# Patient Record
Sex: Female | Born: 1985 | Race: White | Hispanic: Yes | Marital: Married
Health system: Southern US, Community
[De-identification: ages and names within clinical notes are randomized; demographics above are authoritative.]

## PROBLEM LIST (undated history)

## (undated) DIAGNOSIS — K219 Gastro-esophageal reflux disease without esophagitis: Secondary | ICD-10-CM

## (undated) DIAGNOSIS — N200 Calculus of kidney: Secondary | ICD-10-CM

## (undated) DIAGNOSIS — Z9889 Other specified postprocedural states: Secondary | ICD-10-CM

## (undated) DIAGNOSIS — I10 Essential (primary) hypertension: Secondary | ICD-10-CM

## (undated) DIAGNOSIS — N920 Excessive and frequent menstruation with regular cycle: Secondary | ICD-10-CM

## (undated) DIAGNOSIS — Z8679 Personal history of other diseases of the circulatory system: Secondary | ICD-10-CM

## (undated) DIAGNOSIS — R002 Palpitations: Secondary | ICD-10-CM

## (undated) HISTORY — DX: Calculus of kidney: N20.0

## (undated) HISTORY — PX: LITHOTRIPSY: SUR834

---
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
## 2005-04-28 ENCOUNTER — Inpatient Hospital Stay: Payer: Self-pay | Admitting: Obstetrics and Gynecology

## 2007-07-02 ENCOUNTER — Other Ambulatory Visit: Payer: Self-pay

## 2007-07-02 ENCOUNTER — Emergency Department: Payer: Self-pay | Admitting: Emergency Medicine

## 2007-07-03 ENCOUNTER — Other Ambulatory Visit: Payer: Self-pay

## 2009-08-26 ENCOUNTER — Inpatient Hospital Stay: Payer: Self-pay

## 2014-01-25 ENCOUNTER — Observation Stay: Payer: Self-pay | Admitting: Obstetrics and Gynecology

## 2014-01-25 LAB — URINALYSIS, COMPLETE
Bacteria: NEGATIVE
Bilirubin,UR: NEGATIVE
Glucose,UR: NEGATIVE mg/dL (ref 0–75)
Ketone: NEGATIVE
NITRITE: NEGATIVE
Ph: 6 (ref 4.5–8.0)
Protein: 30
SPECIFIC GRAVITY: 1.01 (ref 1.003–1.030)

## 2014-01-28 LAB — CBC WITH DIFFERENTIAL/PLATELET
Basophil #: 0 10*3/uL (ref 0.0–0.1)
Basophil %: 0.1 %
EOS PCT: 0.1 %
Eosinophil #: 0 10*3/uL (ref 0.0–0.7)
HCT: 29.8 % — ABNORMAL LOW (ref 35.0–47.0)
HGB: 9.8 g/dL — ABNORMAL LOW (ref 12.0–16.0)
LYMPHS ABS: 1.1 10*3/uL (ref 1.0–3.6)
Lymphocyte %: 7 %
MCH: 31.2 pg (ref 26.0–34.0)
MCHC: 33 g/dL (ref 32.0–36.0)
MCV: 95 fL (ref 80–100)
Monocyte #: 1 x10 3/mm — ABNORMAL HIGH (ref 0.2–0.9)
Monocyte %: 6.9 %
NEUTROS ABS: 13 10*3/uL — AB (ref 1.4–6.5)
Neutrophil %: 85.9 %
PLATELETS: 177 10*3/uL (ref 150–440)
RBC: 3.16 10*6/uL — ABNORMAL LOW (ref 3.80–5.20)
RDW: 13.6 % (ref 11.5–14.5)
WBC: 15.1 10*3/uL — AB (ref 3.6–11.0)

## 2014-01-28 LAB — COMPREHENSIVE METABOLIC PANEL
ALBUMIN: 2.5 g/dL — AB (ref 3.4–5.0)
ALK PHOS: 179 U/L — AB
Anion Gap: 11 (ref 7–16)
BILIRUBIN TOTAL: 0.3 mg/dL (ref 0.2–1.0)
BUN: 9 mg/dL (ref 7–18)
CALCIUM: 8.6 mg/dL (ref 8.5–10.1)
CHLORIDE: 106 mmol/L (ref 98–107)
CO2: 20 mmol/L — AB (ref 21–32)
Creatinine: 0.9 mg/dL (ref 0.60–1.30)
EGFR (African American): >60
EGFR (Non-African Amer.): >60
GLUCOSE: 87 mg/dL (ref 65–99)
Osmolality: 272 (ref 275–301)
POTASSIUM: 3.7 mmol/L (ref 3.5–5.1)
SGOT(AST): 28 U/L (ref 15–37)
SGPT (ALT): 19 U/L
Sodium: 137 mmol/L (ref 136–145)
Total Protein: 6.7 g/dL (ref 6.4–8.2)

## 2014-01-28 LAB — URINALYSIS, COMPLETE
BACTERIA: NONE SEEN
Bilirubin,UR: NEGATIVE
Blood: NEGATIVE
Glucose,UR: NEGATIVE mg/dL (ref 0–75)
LEUKOCYTE ESTERASE: NEGATIVE
NITRITE: NEGATIVE
PH: 6 (ref 4.5–8.0)
PROTEIN: NEGATIVE
RBC,UR: 1 /HPF (ref 0–5)
Specific Gravity: 1.014 (ref 1.003–1.030)
Squamous Epithelial: 1

## 2014-01-29 ENCOUNTER — Inpatient Hospital Stay: Payer: Self-pay | Admitting: Obstetrics and Gynecology

## 2014-01-29 ENCOUNTER — Ambulatory Visit: Payer: Self-pay | Admitting: Urology

## 2014-01-29 LAB — URINE CULTURE

## 2014-01-30 ENCOUNTER — Ambulatory Visit: Payer: Self-pay | Admitting: Urology

## 2014-01-30 LAB — HEMATOCRIT: HCT: 24.3 % — AB (ref 35.0–47.0)

## 2014-02-01 LAB — BASIC METABOLIC PANEL
Anion Gap: 9 (ref 7–16)
BUN: 12 mg/dL (ref 7–18)
CHLORIDE: 108 mmol/L — AB (ref 98–107)
CREATININE: 0.59 mg/dL — AB (ref 0.60–1.30)
Calcium, Total: 7.5 mg/dL — ABNORMAL LOW (ref 8.5–10.1)
Co2: 25 mmol/L (ref 21–32)
EGFR (Non-African Amer.): >60
Glucose: 61 mg/dL — ABNORMAL LOW (ref 65–99)
Osmolality: 281 (ref 275–301)
Potassium: 3.7 mmol/L (ref 3.5–5.1)
Sodium: 142 mmol/L (ref 136–145)

## 2014-02-04 DIAGNOSIS — N201 Calculus of ureter: Secondary | ICD-10-CM

## 2014-02-04 HISTORY — DX: Calculus of ureter: N20.1

## 2014-03-02 HISTORY — PX: LITHOTRIPSY: SUR834

## 2014-10-28 NOTE — Consult Note (Signed)
Chief Complaint:  Subjective/Chief Complaint LEFT UPJ stone  Subjective: NAD, resting comfortably. AFVSS.   VITAL SIGNS/ANCILLARY NOTES: **Vital Signs.:   28-Jul-15 12:08  Vital Signs Type Routine  Temperature Temperature (F) 98.4  Celsius 36.8  Temperature Source oral  Pulse Pulse 80  Respirations Respirations 18  Systolic BP Systolic BP 413  Diastolic BP (mmHg) Diastolic BP (mmHg) 68  Mean BP 80  BP Source  if not from Vital Sign Device non-invasive  *Intake and Output.:   Daily 28-Jul-15 07:00  Grand Totals Intake:  3004 Output:  1400    Net:  2440 24 Hr.:  1027    08:15  Grand Totals Intake:  240 Output:      Net:  240 24 Hr.:  240    Shift 15:00  Grand Totals Intake:  240 Output:      Net:  240 24 Hr.:  240   Brief Assessment:  GEN well developed, well nourished, no acute distress   Cardiac Regular   Respiratory normal resp effort   Gastrointestinal Normal   Gastrointestinal details normal Soft  Nontender  Nondistended   EXTR negative cyanosis/clubbing   Additional Physical Exam GU: no CVA tenderness Psych: appropriate mood & affect, conversant, answers questions appropriately, pleasant, accompanied by husband Neuro: A&Ox3, NCAT, EOMI   Lab Results: Hepatic:  25-Jul-15 01:39   Bilirubin, Total 0.3  Alkaline Phosphatase  179 (46-116 NOTE: New Reference Range 01/24/14)  SGPT (ALT) 19 (14-63 NOTE: New Reference Range 01/24/14)  SGOT (AST) 28  Total Protein, Serum 6.7  Albumin, Serum  2.5  Routine BB:  25-Jul-15 01:39   ABO Group + Rh Type O Positive  Antibody Screen NEGATIVE (Result(s) reported on 29 Jan 2014 at 02:36AM.)  General Ref:  25-Jul-15 01:39   RPR w/ Reflex to Qt RPR and Conf. TP-PA ========== TEST NAME ==========  ========= RESULTS =========  = REFERENCE RANGE =  RPR W/REFLX QN,CONF TPPA  RPR, Rfx Qn RPR/Confirm TP RPR                             [   Non Reactive         ]      Non Reactive               [facility]             No: [id number]           [address], [place], [place] [address]           Lindon Romp, MD         [phone]   Result(s) reported on 30 Jan 2014 at 08:41AM.  Routine Chem:  25-Jul-15 01:39   Glucose, Serum 87  BUN 9  Creatinine (comp) 0.90  Sodium, Serum 137  Potassium, Serum 3.7  Chloride, Serum 106  CO2, Serum  20  Calcium (Total), Serum 8.6  Osmolality (calc) 272  eGFR (African American) >60  eGFR (Non-African American) >60 (eGFR values <42m/min/1.73 m2 may be an indication of chronic kidney disease (CKD). Calculated eGFR is useful in patients with stable renal function. The eGFR calculation will not be reliable in acutely ill patients when serum creatinine is changing rapidly. It is not useful in  patients on dialysis. The eGFR calculation may not be applicable to patients at the low and high extremes of body sizes, pregnant women, and vegetarians.)  Anion Gap 11  Routine Hem:  25-Jul-15 01:39   Hematocrit (CBC)  29.8  WBC (CBC)  15.1  RBC (CBC)  3.16  Hemoglobin (CBC)  9.8  Platelet Count (CBC) 177  MCV 95  MCH 31.2  MCHC 33.0  RDW 13.6  Neutrophil % 85.9  Lymphocyte % 7.0  Monocyte % 6.9  Eosinophil % 0.1  Basophil % 0.1  Neutrophil #  13.0  Lymphocyte # 1.1  Monocyte #  1.0  Eosinophil # 0.0  Basophil # 0.0 (Result(s) reported on 28 Jan 2014 at 03:06AM.)   Radiology Results: CT:    27-Jul-15 14:03, CT Abdomen Pelvis WO for Stone  CT Abdomen Pelvis WO for Stone   REASON FOR EXAM:    flank pain, recent ultrasound suggests presence of   left UPJ stone  COMMENTS:       PROCEDURE: CT  - CT ABDOMEN /PELVIS WO (STONE)  - Jan 30 2014  2:03PM     CLINICAL DATA:  Left flank pain.    EXAM:  CT ABDOMEN AND PELVIS WITHOUT CONTRAST    TECHNIQUE:  Multidetector CT imaging of the abdomen and pelvis was performed  following the standard protocol without IV contrast.  COMPARISON:  None.    FINDINGS:  Visualized lung bases appear  normal. No significant osseous  abnormalityis noted.    No gallstones are noted. No focal abnormality is noted the liver,  spleen or pancreas. Patient is status post recent Cesarean section.  Enlarged uterus is noted consistent with postpartum status. Soft  tissue gas is noted in the anteriorsubcutaneous tissues consistent  with recent surgery. Adrenal glands appear normal. Right kidney  appears normal. Mild left hydronephrosis is noted secondary to 9 x 6  mm calculus in the proximal left ureter just beyond the  ureteropelvic junction. The appendix appears normal. Urinary bladder  appears normal.   IMPRESSION:  Enlarged uterus and other postoperative findings consistent with  recent cesarean section.    Mild left hydronephrosis is noted secondary to 9 x 6 mm calculus in  the proximalleft ureter, just beyond the ureteropelvic junction.      Electronically Signed    By: Sabino Dick M.D.    On: 01/30/2014 14:42         Verified By: Marveen Reeks, M.D.,   Assessment/Plan:  Assessment/Plan:  Assessment Pleasant 29 YO woman recently postpartum s/p c-section with a 43m LEFT proximal UPJ stone. CT abdomen/pelvis from 7/27 personally reviewed. UCx negative. Labs stable and normal. Symptoms minimal.   Plan Patient should have outpatient treatment of stone. I again reiterated this in our conversation today. Risks of not treating the stone include potential for urosepsis and renal atrophy over time which can both affect her health in significant ways. I advise follow up with urology in 2 weeks.   I spoke to Dr. WYves Dill local urologist, and provided patient information to him for eventual treatment with lithotripsy and USE. Patient may also follow up at nearby academic institutions, both options and information provided.   I again stressed that should Ms. RTerisa Starrexerience nondescript malaise or fevers at home, she should present to the ED for evaluation for consideration of more urgent left  kidney decompression with a stent vs nephrostomy tube depending on her clinical presentation. She and her husband endorse understanding of the entirety of my conversation and will arrange follow up.   Thank for this interesting consult. It was a pleasure to partake in Ms. RGargus care.   Electronic Signatures: KLum Babe(MD)  (Signed  28-Jul-15 13:41)  Authored: Chief Complaint, VITAL SIGNS/ANCILLARY NOTES, Brief Assessment, Lab Results, Radiology Results, Assessment/Plan   Last Updated: 28-Jul-15 13:41 by Lum Babe (MD)

## 2014-10-28 NOTE — Op Note (Signed)
PATIENT NAME:  Mills KollerROJAS, Gurneet MR#:  [id number] DATE OF BIRTH:  March 10, 1986  DATE OF PROCEDURE:  01/29/2014  PREOPERATIVE DIAGNOSIS:  Breech presentation. Term pregnancy.   POSTOPERATIVE DIAGNOSIS:  Breech presentation. Term pregnancy.  PROCEDURE PERFORMED: Low transverse cesarean section. Placement of On-Q pain pump.   SURGEON: Annamarie MajorPaul Christyn Gutkowski, M.D.   ASSISTANT: Midwife Yetta BarreJones.   ANESTHESIA: Spinal.   ESTIMATED BLOOD LOSS: 500 mL.   COMPLICATIONS: None.   FINDINGS: Normal tubes, ovaries, and uterus, breech presentation. Sacrum anterior, no nuchal  cord or other complications.   DISPOSITION: To the recovery room in stable condition.   TECHNIQUE: The patient is prepped and draped in the usual sterile fashion after adequate anesthesia is obtained in the supine position on the operating room table. Scalpel used to make low transverse skin incision down to level of the rectus fascia which is dissected bilaterally using Mayo scissors. Rectus muscles are separated in midline. The peritoneum is penetrated, the bladder is inferiorly dissected and retracted. A scalpel was used to create a low transverse hysterotomy incision then extended by blunt dissection. Amniotomy then reveals clear fluid and the breech is palpated as sacrum anterior. The breech initially is unable to be rotated and due to its being a frank breech presentation the feet are grasped and delivered through the hysterotomy incision without complication or too much strain on the baby. Gentle downward traction is applied until the level of the axilla is reached, and the arms are carefully swept across the chest and delivered and then the head is easily delivered. The oropharynx is suctioned and umbilical cord is clamped and cut and the infant handed to the pediatric team.   Cord blood is obtained. The placenta is manually extracted. The uterus is externalized and cleansed of all membranes and debris using a moist sponge. The hysterotomy incision  is closed with running 1-Vicryl suture in a locking fashion followed by a second layer to imbricate the first layer with excellent hemostasis noted. The uterus is placed back in the intraabdominal cavity and the paracolic gutters are irrigated with warm saline. Re-examination of the incision reveals excellent hemostasis. The peritoneum is closed with a Vicryl suture.   Trocars are placed through the abdomen into the subfascial space and then these trocars were used to place and thread the Silver Soaker catheters associated with the On-Q pain pump. The rectus fascia is then closed with 0-Maxon suture with careful placement not to incorporate these catheters. The catheters are flushed with 5 mL each of bupivacaine and stabilized into place with a dressing.   Subcutaneous tissues are irrigated and hemostasis is assured using electrocautery. Skin is closed with 4-0 Vicryl suture in a subcuticular fashion followed by placement of Dermabond for complete closure and protection of the incision. The patient goes to the recovery room in stable condition. All sponge, instrument, and needle counts are correct.   ____________________________ R. Annamarie MajorPaul Thuy Atilano, MD rph:lt D: 01/29/2014 10:33:34 ET T: 01/29/2014 11:06:54 ET JOB#: [id number]  cc: Dierdre Searles. Paul Armonie Staten, MD, <Dictator> Nadara MustardOBERT P Zakariah Dejarnette MD ELECTRONICALLY SIGNED 01/29/2014 17:27

## 2014-10-28 NOTE — Consult Note (Signed)
Chief Complaint:  Subjective/Chief Complaint LEFT hydronephrosis, possible UPJ stone  Subjective: Pain minimal, possibly due to recent c-section. AFVSS. Appears comfortable. Spending time with newborn daughter and husband at bedside.   VITAL SIGNS/ANCILLARY NOTES: **Vital Signs.:   27-Jul-15 07:35  Vital Signs Type Routine  Temperature Temperature (F) 98.6  Celsius 37  Temperature Source oral  Pulse Pulse 80  Respirations Respirations 20  Systolic BP Systolic BP 498  Diastolic BP (mmHg) Diastolic BP (mmHg) 70  Mean BP 83  BP Source  if not from Vital Sign Device non-invasive  *Intake and Output.:   27-Jul-15 00:00  Grand Totals Intake:   Output:  300    Net:  -300 24 Hr.:  256    06:02  Grand Totals Intake:   Output:  210    Net:  -210 24 Hr.:  46    Shift 07:00  Grand Totals Intake:   Output:  510    Net:  -510 24 Hr.:  46    Daily 07:00  Grand Totals Intake:  1981 Output:  1935    Net:  25 24 Hr.:  46    07:30  Grand Totals Intake:  240 Output:      Net:  240 24 Hr.:  240    10:33  Grand Totals Intake:  1544 Output:      Net:  2641 58 Hr.:  3094    12:21  Grand Totals Intake:   Output:      Net:   24 Hr.:  0768    12:28  Grand Totals Intake:   Output:  300    Net:  -300 24 Hr.:  0881    Shift 15:00  Grand Totals Intake:  1784 Output:  300    Net:  27 24 Hr.:  1031   Brief Assessment:  GEN well developed, well nourished, no acute distress, post-partum   Cardiac Regular   Respiratory normal resp effort   Gastrointestinal Normal   Gastrointestinal details normal Soft  Nontender  Nondistended   EXTR no c/c/e   Additional Physical Exam GU: voiding, no issues. no CVA tenderness.  Psych: conversant, Tanya, appropriate mood & affect Neuro: NCAT, EOMI, A&Ox3   Lab Results:  Hepatic:  25-Jul-15 01:39   Bilirubin, Total 0.3  Alkaline Phosphatase  179 (46-116 NOTE: New Reference Range 01/24/14)  SGPT (ALT) 19 (14-63 NOTE: New  Reference Range 01/24/14)  SGOT (AST) 28  Total Protein, Serum 6.7  Albumin, Serum  2.5  Routine BB:  25-Jul-15 01:39   ABO Group + Rh Type O Positive  Antibody Screen NEGATIVE (Result(s) reported on 29 Jan 2014 at 02:36AM.)  General Ref:  25-Jul-15 01:39   RPR w/ Reflex to Qt RPR and Conf. TP-PA ========== TEST NAME ==========  ========= RESULTS =========  = REFERENCE RANGE =  RPR W/REFLX QN,CONF TPPA  RPR, Rfx Qn RPR/Confirm TP RPR                             [   Non Reactive         ]      Non Reactive               [facility]            No: [id number]           [address], [place], [place] [address]  Lindon Romp, MD         [phone]   Result(s) reported on 30 Jan 2014 at 08:41AM.  Routine Chem:  25-Jul-15 01:39   Glucose, Serum 87  BUN 9  Creatinine (comp) 0.90  Sodium, Serum 137  Potassium, Serum 3.7  Chloride, Serum 106  CO2, Serum  20  Calcium (Total), Serum 8.6  Osmolality (calc) 272  eGFR (African American) >60  eGFR (Non-African American) >60 (eGFR values <58m/min/1.73 m2 may be an indication of chronic kidney disease (CKD). Calculated eGFR is useful in patients with stable renal function. The eGFR calculation will not be reliable in acutely ill patients when serum creatinine is changing rapidly. It is not useful in  patients on dialysis. The eGFR calculation may not be applicable to patients at the low and high extremes of body sizes, pregnant women, and vegetarians.)  Anion Gap 11  Routine Hem:  25-Jul-15 01:39   Hematocrit (CBC)  29.8  WBC (CBC)  15.1  RBC (CBC)  3.16  Hemoglobin (CBC)  9.8  Platelet Count (CBC) 177  MCV 95  MCH 31.2  MCHC 33.0  RDW 13.6  Neutrophil % 85.9  Lymphocyte % 7.0  Monocyte % 6.9  Eosinophil % 0.1  Basophil % 0.1  Neutrophil #  13.0  Lymphocyte # 1.1  Monocyte #  1.0  Eosinophil # 0.0  Basophil # 0.0 (Result(s) reported on 28 Jan 2014 at [facility])   Radiology Results: [place]     25-Jul-15 11:28, [place]  [place]   REASON FOR EXAM:    severe left flank pain  COMMENTS:       PROCEDURE: [place] - [place]  - Jan 28 2014 11:28AM     CLINICAL DATA:  Severe left flank pain. Thirty-eight week pregnant  patient.    EXAM:  RENAL/URINARY TRACT ULTRASOUND COMPLETE    COMPARISON:  No priors.    FINDINGS:  Right Kidney:  Length: 11 cm. Echogenicity within normal limits. No mass or frank  hydronephrosis visualized. Mild fullness of the right renal pelvis.    Left Kidney:    Length: 12.4 cm. Echogenicity within normal limits. Mild left  hydronephrosis which persisted on postvoid images. Either at or  immediately beyond the left ureteral pelvic junction there is a 8 mm  echogenic focus with posterior acoustic shadowing, concerning for a  ureteral stone. No mass visualized.    Bladder:    Poorly distended.  No jets could be visualized.   IMPRESSION:  1. Mild left hydronephrosis which appears to be related to an 8 mm  calculus at or immediately beyond the left ureteropelvic junction.  2. Mild fullness of the right renal pelvis is likely related to  compression of the distal ureter from a gravid uterus. No frank  right hydronephrosis.  These results will be called to the ordering clinician or  representative by the Radiologist Assistant, and communication  documented in the PACS or zVision Dashboard.      Electronically Signed    By: DVinnie LangtonM.D.    On: 01/28/2014 12:18     Verified By: DEtheleen Mayhew M.D.,   Assessment/Plan:  Assessment/Plan:  Assessment Tanya 29YO [place] s/p c-section delivery 01/29/2014. Initially had left flank pain. Renal ultrasound personally reviewd and demonstrates mild-moderate hydronephrosis and possible presence of a left 840mUPJ stone. AFVSS. UCx negative. Currently, symptoms minimal and may be due to recent c-section.   Plan Discussed possibility of the presence of  an obstructing left  ureteral stone and the fact that CT is not an ideal test to assess stone burden. If patient does have an obstructing UPJ stone, would recommend eventual treatment,. Given that she is AFVSS, has a negative UCx, and otherwise appears comfortable, I do not think this needs to be urgently treated. Risks of not treating include renal parenchymal atrophy 2/2 chronic obstruction, and the possibility of developing an infected obstructed stone and urosepsis which can potentially be very serious. I discussed this in detail with patient and her husband. They endorse understanding.   For the time being, I recommend:  1. non-contrast Stone protocol CT of abdomen/pelvis today.  2. If patient has worsening symptoms (flank pain) or high grade fevers (> 101.5), would recommend more urgent intervention with stent vs nephrostomy tube depending on acuity as a temporizing measure to manage symptoms or allow for drainage of infected urine with future stone treatment at a later date. If patient discharged without treatment, I have advised her to return to ED for evaluation if she experiences fevers at home for worry of an obstructing infected stone.  3. Given potential size and location of stone (71m @ the LEFT UPJ), I do not believe this will pass without intervention. Tentative plan for outpatient treatment (ureteroscopic stone extraction with possible laser lithotripsy) if patient remains AFVSS, uninfected, and her pain/symptoms are adequatly managed with conservative therapy.   Thank you for this interesting consult. It was a pleasure to partake in Ms. RVitelli care.   Electronic Signatures: KLum Babe(MD)  (Signed 27-Jul-15 12:57)  Authored: Chief Complaint, VITAL SIGNS/ANCILLARY NOTES, Brief Assessment, Lab Results, Radiology Results, Assessment/Plan   Last Updated: 27-Jul-15 12:57 by KLum Babe(MD)

## 2014-10-28 NOTE — Consult Note (Signed)
Admit Diagnosis:   BACK PAIN CONTRACTIONS: Onset Date: 29-Jan-2014, Status: Active, Description: BACK PAIN CONTRACTIONS      Admit Reason:   Labor and delivery, indication for care (659.90): Onset Date: 28-Jan-2014, Status: Active, Coding System: ICD9, Coded Name: Unspecified indication for care or intervention related to labor and delivery, unspecified as to episode of care, Description: Code 646.21 Renal disease in pregnancy indicating IOL for management    Tuberculosis: Was treated for 9 months in 1993.  Was 29 or 29 years old.   Denies medical history:    benign tumor removed from abd in teens:     Ondansetron injection,  ( Zofran injection )  4 mg, IV push, q4h PRN for nausea, vomiting  Indication: Nausea/ Vomiting, 28-Jan-2014, Discontinued, Standard   Sodium Chloride 0.9% injection, 3 ml, IV push, q6h, Convert IV to Saline Blanco, 28-Jan-2014, Discontinued, Standard   MorphINE  injection, 4 mg, IV push, once  Indication: Pain, [Med Admin Window: 30 mins before or after scheduled dose], 28-Jan-2014, Completed, Standard   Terbutaline Sulfate injection, ( Brethine injection ) Conditional Order  0.25 mg, Subcutaneous, once  Indication: Bronchospasm/ Hyperkalemia, 28-Jan-2014, Discontinued, Standard   Triazolam tablet, ( Halcion)  0.25 mg Oral at bedtime PRN for sleep  - Indication: Insomnia/ Sedative/ Hypnotic  Instructions:  sleep/ insomnia, 28-Jan-2014, Discontinued, Standard   Dinoprostone 37m Vag suppository,  ( Cervidil 10 mg Vag suppository )  10 mg Vaginal once  -Indication:Abortion/ Missed Abortion/ Cervical Ripening, 28-Jan-2014, Completed, Standard   MorphINE  injection, 2 mg, IV push, q1-2h PRN for moderate pain (4-6/10)  Indication: Pain, [Med Admin Window: 30 mins before or after scheduled dose], 28-Jan-2014, Discontinued, Standard   CefOXitin injection, ( Mefoxin injection )  ***Send with Patient to OR  2 gram, IV Piggyback, once, Infuse over 30  minute(s)  Indication: Infection, if patient >80kg (>176 lbs), 29-Jan-2014, Active, Standard  Home Medications: Medication Status  inte Active   Lab Results:  Routine Micro:  25-Jul-15 23:21   Micro Text Report URINE CULTURE   COMMENT                   NO GROWTH IN 18-24 HOURS   ANTIBIOTIC                       Routine Chem:  25-Jul-15 01:39   Glucose, Serum 87  BUN 9  Creatinine (comp) 0.90  Sodium, Serum 137  Potassium, Serum 3.7  Chloride, Serum 106  CO2, Serum  20  Calcium (Total), Serum 8.6  Osmolality (calc) 272  eGFR (African American) >60  eGFR (Non-African American) >60 (eGFR values <68mmin/1.73 m2 may be an indication of chronic kidney disease (CKD). Calculated eGFR is useful in patients with stable renal function. The eGFR calculation will not be reliable in acutely ill patients when serum creatinine is changing rapidly. It is not useful in  patients on dialysis. The eGFR calculation may not be applicable to patients at the low and high extremes of body sizes, pregnant women, and vegetarians.)  Anion Gap 11  Routine UA:  25-Jul-15 23:21   Color (UA) Yellow  Clarity (UA) Clear  Glucose (UA) Negative  Bilirubin (UA) Negative  Ketones (UA) Trace  Specific Gravity (UA) 1.014  Blood (UA) Negative  pH (UA) 6.0  Protein (UA) Negative  Nitrite (UA) Negative  Leukocyte Esterase (UA) Negative (Result(s) reported on 28 Jan 2014 at 01:28AM.)  RBC (UA) 1 /HPF  WBC (UA) 1 /HPF  Bacteria (  UA) NONE SEEN  Epithelial Cells (UA) 1 /HPF (Result(s) reported on 28 Jan 2014 at 01:28AM.)  Routine Hem:  25-Jul-15 01:39   WBC (CBC)  15.1  RBC (CBC)  3.16  Hematocrit (CBC)  29.8  Platelet Count (CBC) 177   Radiology Results:  Radiology Results: [place]:    25-Jul-15 11:28, US Kidney Bilateral  US Kidney Bilateral  REASON FOR EXAM:    severe left flank pain  COMMENTS:       PROCEDURE: [place]  - US KIDNEY  - Jan 28 2014 11:28AM     CLINICAL DATA:  Severe left flank pain.  Thirty-eight week pregnant  patient.    EXAM:  RENAL/URINARY TRACT ULTRASOUND COMPLETE    COMPARISON:  No priors.    FINDINGS:  Right Kidney:  Length: 11 cm. Echogenicity within normal limits. No mass or frank  hydronephrosis visualized. Mild fullness of the right renal pelvis.    Left Kidney:    Length: 12.4 cm. Echogenicity within normal limits. Mild left  hydronephrosis which persisted on postvoid images. Either at or  immediately beyond the left ureteral pelvic junction there is a 8 mm  echogenic focus with posterior acoustic shadowing, concerning for a  ureteral stone. No mass visualized.    Bladder:    Poorly distended.  No jets could be visualized.   IMPRESSION:  1. Mild left hydronephrosis which appears to be related to an 8 mm  calculus at or immediately beyond the left ureteropelvic junction.  2. Mild fullness of the right renal pelvis is likely related to  compression of the distal ureter from a gravid uterus. No frank  right hydronephrosis.  These results will be called to the ordering clinician or  representative by the Radiologist Assistant, and communication  documented in the PACS or zVision Dashboard.      Electronically Signed    By: Vinnie Langton M.D.    On: 01/28/2014 12:18     Verified By: Etheleen Mayhew, M.D.,    No Known Allergies:    General Aspect Admitted to L&D   Present Illness 29 y.o. admitted to L&D at [redacted] weeks gestation due to flank pain.  She had intermittent sever pain for several days on admit.  She noted nausea and difficulty tolerating PO prior to admission.  Pain remained sever on admission.  U/S at that time showed left hydronephrosis and likely 8 mm Left ureteral stone.  Discussion between patient, mid-wife, and myself at that time regarding urgent stent placement versus induction of labor resulted in patient choosing to induce labor rather that undergo risk of anesthesia for stent placement.  Original plan is for  re-evaluation of ureteral colic sx's and stone post-partum.  Since that decision patient was induced and ultimately proceeded to C-section due to breech presentation.  Post- C-section, patient has limited nausea currently and pain is controlled.   Case History and Physical Exam:  Chief Complaint Nausea/Vomiting  flank pain   Past Surgical History abd surgery (childhood)   Family History Non-Contributory   Neck/Nodes Supple  No Adenopathy   Chest/Lungs Clear   Breasts Not examined   Cardiovascular Normal Sinus Rhythm   Abdomen gravid, s/p c-section, dressed   Genitalia Not examined   Rectal Not examined   Musculoskeletal Full range of motion   Neurological Grossly WNL   Skin WNL    Impression 29 y.o. Female post-partum Day 0 (C-section) with U/S concerning for left hydro and 8 mm distal ureteral stone.  Given acute change  in status with delivery and current pain control will elect to observe at this time. Would recommend Stone protocal CT in next day or so if sx's persist after anethesia wears off.  Urgent stent placement vs. elective stone managment as outpatient would depend on her repeat imaging and symptoms at that time.   Plan - cont. pain control - Recommend non-contrast stone CT in next 24-48 if pain persists or clinically worsens - Left ureteral stent placement vs. outpatient stone managment dependent upon her clinical course and subsequent imaging.   Electronic Signatures: Felicity Coyer (MD)  (Signed 26-Jul-15 10:56)  Authored: Health Issues, Significant Events - History, Medications, Home Medications, Labs, Radiology Results, Allergies, General Aspect/Present Illness, History and Physical Exam, Impression/Plan   Last Updated: 26-Jul-15 10:56 by Felicity Coyer (MD)

## 2014-11-14 NOTE — H&P (Signed)
L&D Evaluation:  History:  HPI 29yo [id number] with LMP of 05/02/13 & EDD of  02/15/14 & US confirmed date of 02/06/14 [facility]. Pt has a hx of MVP with no medications required for procedures. Pt presented today to office with lower Lt lumbar pain rating it as a 9 on 1-10 scale. Pain is intermittant and pt states she cannot eat or rest due to the severity of the discomfort. Pt has vomited 2 x this pm due to the pain. An early ua indicated pt had ketones and trace hematuria after a vag exam. Cx was 1/20/vtx -3/ Pt has had no UC's, VB, labor S/S or decreased FM. Dr Feliberto GottronSchermerhorn [place] and sent [place] home with Norco. Her insurance would not fill the RX so pt unable to take her meds. No recent travel, chills, aching, sweats, HA, only vomiting with the Lt lower back painh   Presents with back pain, Lt lower back   Patient's Medical History MVP   Patient's Surgical History none   Medications Pre Natal Vitamins   Allergies NKDA   Social History none   Family History Non-Contributory   ROS:  ROS All systems were reviewed.  HEENT, CNS, GI, GU, Respiratory, CV, Renal and Musculoskeletal systems were found to be normal.   Exam:  Vital Signs stable   General no apparent distress   Mental Status clear   Chest clear   Heart normal sinus rhythm, no murmur/gallop/rubs   Abdomen gravid, non-tender   Estimated Fetal Weight Average for gestational age   Fetal Position vtx   Back no upper CVAT but, sl tender in the lower leeft backl   Edema no edema   Reflexes 1+   Pelvic 1/20%/vtx-3   Mebranes Intact   FHT normal rate with no decels, reactive NST   Ucx irregular   Skin dry   Lymph no lymphadenopathy   Impression:  Impression IUP at 38 5/7 wks with Lt nephrolethiasis   Plan:  Plan Admit for OBS for pain control and US   Comments Disc the case with Dr Edison PaceS. jackson and he agrees with admit and obs for kidney stone  management. At this time, will attemp to hydrate and do US to see if a stone is in the ureter.   Electronic Signatures: Tanya Conley, Tanya Conley (CNM)  (Signed 25-Jul-15 01:12)  Authored: L&D Evaluation   Last Updated: 25-Jul-15 01:12 by Tanya Conley, Tanya Conley (CNM)

## 2016-06-12 IMAGING — CT CT STONE STUDY
1 of 4 series · 5 of 46 positions shown, 10 images · non-contrast
Comparison: None.

CLINICAL DATA: Left flank pain.

EXAM:
CT ABDOMEN AND PELVIS WITHOUT CONTRAST
TECHNIQUE: Multidetector CT imaging of the abdomen and pelvis was performed
following the standard protocol without IV contrast.

[Series 4: lung windows · axial · 0.69mm/px · z∈[-111,-36]mm · 5 of 23 slices shown, 10 images]
[im 4/23  soft-tissue]
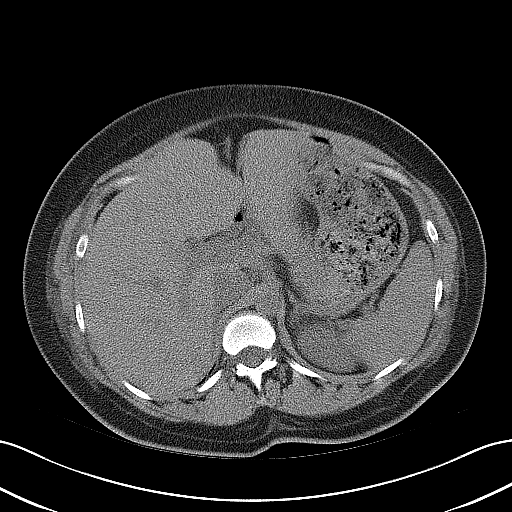
[im 4/23  bone]
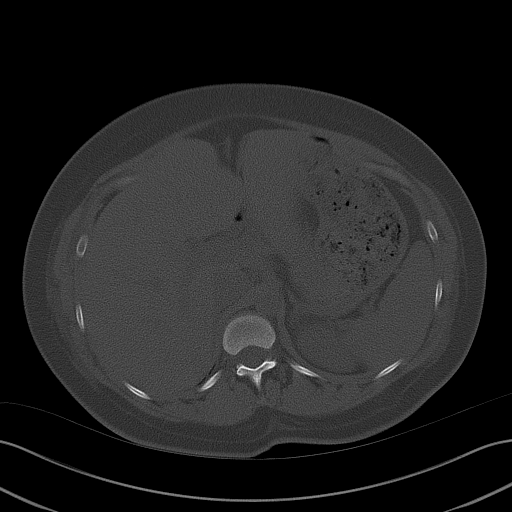
[im 8/23  soft-tissue]
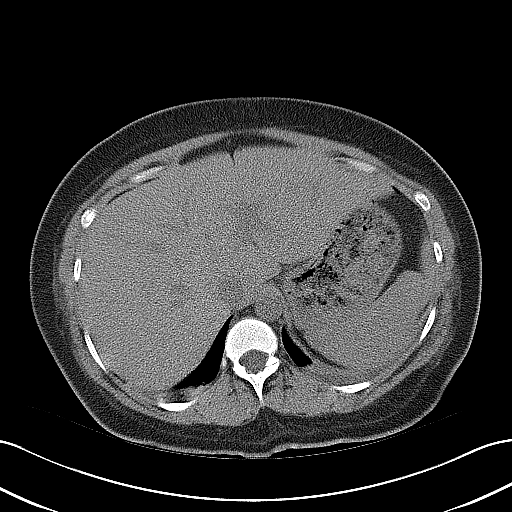
[im 8/23  lung]
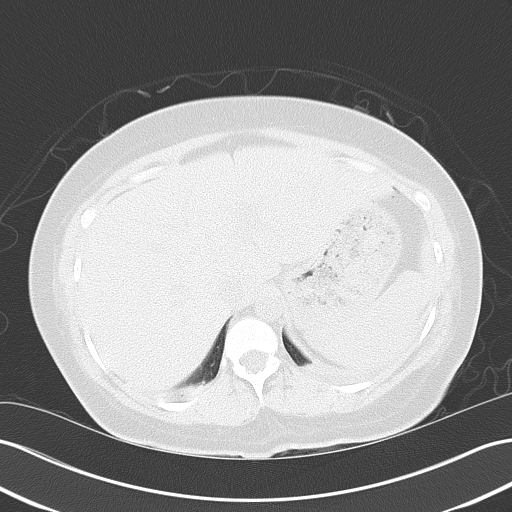
[im 12/23  soft-tissue]
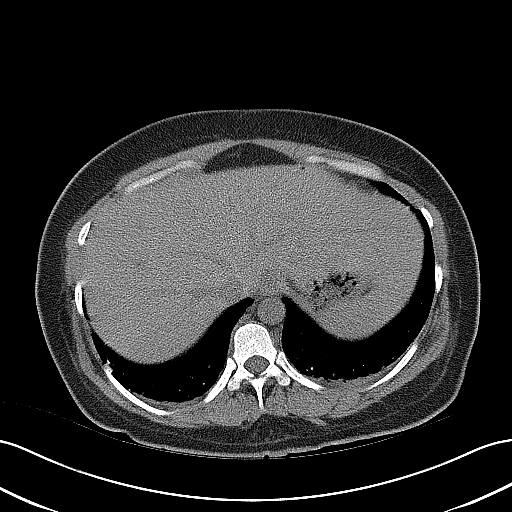
[im 12/23  lung]
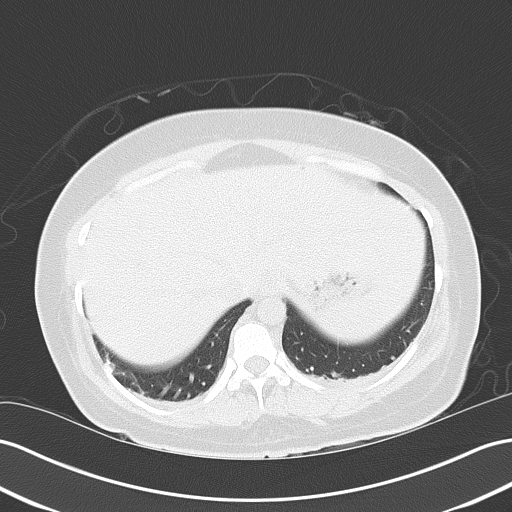
[im 15/23  soft-tissue]
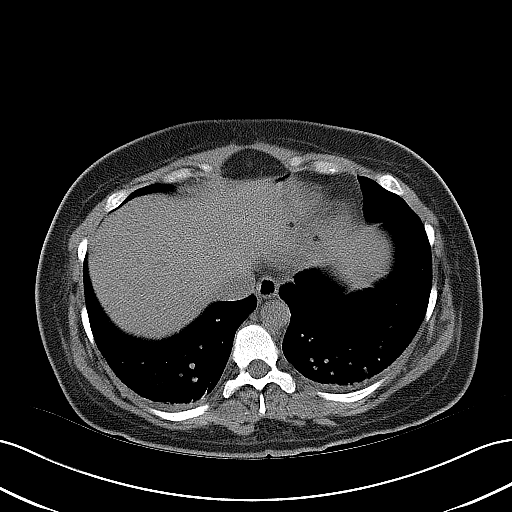
[im 15/23  lung]
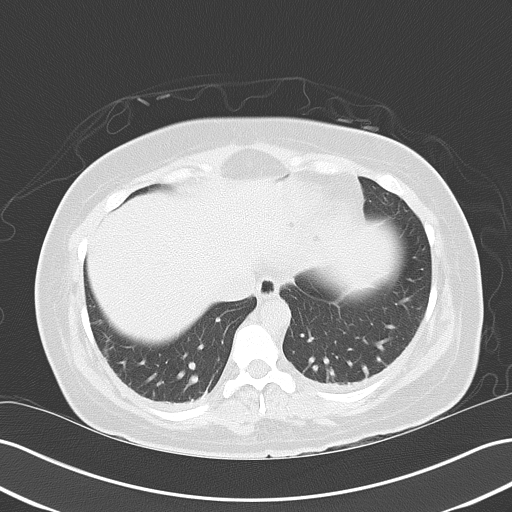
[im 19/23  soft-tissue]
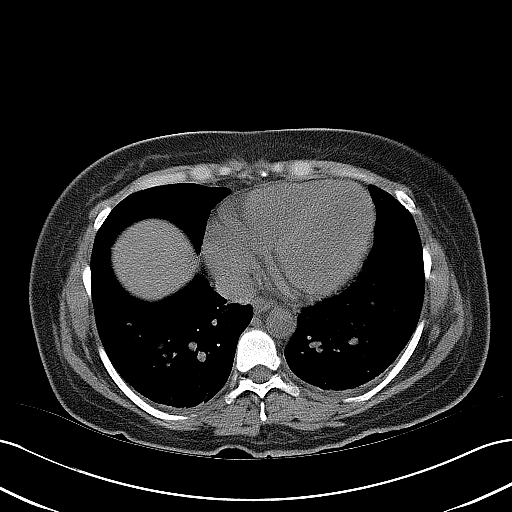
[im 19/23  lung]
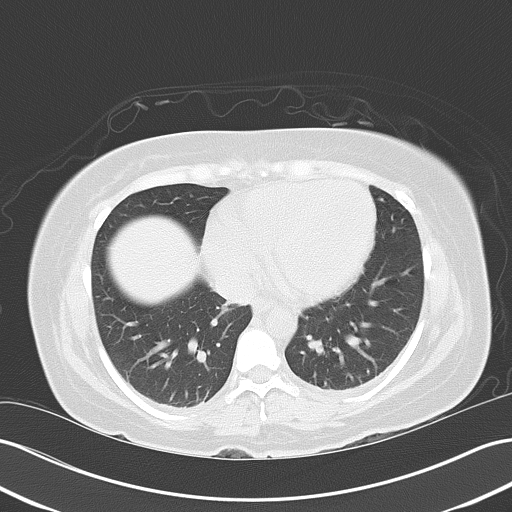

[5 of 46 positions shown; findings below may reference images not displayed]

FINDINGS: Visualized lung bases appear normal. No significant osseous
abnormality is noted.

No gallstones are noted. No focal abnormality is noted the liver,
spleen or pancreas. Patient is status post recent Cesarean section.
Enlarged uterus is noted consistent with postpartum status. Soft
tissue gas is noted in the anterior subcutaneous tissues consistent
with recent surgery. Adrenal glands appear normal. Right kidney
appears normal. Mild left hydronephrosis is noted secondary to 9 x 6
mm calculus in the proximal left ureter just beyond the
ureteropelvic junction. The appendix appears normal. Urinary bladder
appears normal.
IMPRESSION: Enlarged uterus and other postoperative findings consistent with
recent cesarean section.

Mild left hydronephrosis is noted secondary to 9 x 6 mm calculus in
the proximal left ureter, just beyond the ureteropelvic junction.

## 2019-09-01 ENCOUNTER — Other Ambulatory Visit: Payer: Self-pay | Admitting: Internal Medicine

## 2019-09-01 DIAGNOSIS — R5383 Other fatigue: Secondary | ICD-10-CM

## 2019-09-01 DIAGNOSIS — R1031 Right lower quadrant pain: Secondary | ICD-10-CM

## 2019-09-01 DIAGNOSIS — R5381 Other malaise: Secondary | ICD-10-CM

## 2019-09-05 ENCOUNTER — Other Ambulatory Visit: Payer: Self-pay

## 2019-09-05 ENCOUNTER — Ambulatory Visit
Admission: RE | Admit: 2019-09-05 | Discharge: 2019-09-05 | Disposition: A | Discharge status: Home / Self Care | Payer: BC Managed Care – PPO | Source: Ambulatory Visit | Attending: Internal Medicine | Admitting: Internal Medicine

## 2019-09-05 DIAGNOSIS — R5381 Other malaise: Secondary | ICD-10-CM

## 2019-09-05 DIAGNOSIS — R1031 Right lower quadrant pain: Secondary | ICD-10-CM

## 2019-09-05 DIAGNOSIS — R5383 Other fatigue: Secondary | ICD-10-CM | POA: Insufficient documentation

## 2019-09-05 MED ORDER — IOHEXOL 300 MG/ML  SOLN
100.0000 mL | Freq: Once | INTRAMUSCULAR | Status: AC | PRN
  Administered 2019-09-05: 100 mL via INTRAVENOUS

## 2022-01-16 IMAGING — CT CT ABD-PELV W/ CM
2 of 4 series · 16 of 46 positions shown, 18 images · IV contrast (omnipaque)
Comparison: 01/30/2014

CLINICAL DATA: Sharp right lower quadrant pain for several months

EXAM:
CT ABDOMEN AND PELVIS WITH CONTRAST
TECHNIQUE: Multidetector CT imaging of the abdomen and pelvis was performed
using the standard protocol following bolus administration of
intravenous contrast.
CONTRAST:  100mL OMNIPAQUE IOHEXOL 300 MG/ML  SOLN

[Series 2: abd pelvis 5.00 · axial · 0.60mm/px · z∈[-1488,-1103]mm · 13 of 85 slices shown, 15 images]
[im 4/85  soft-tissue]
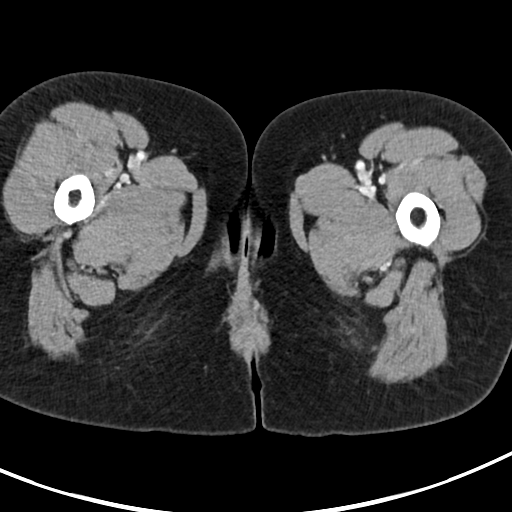
[im 4/85  bone]
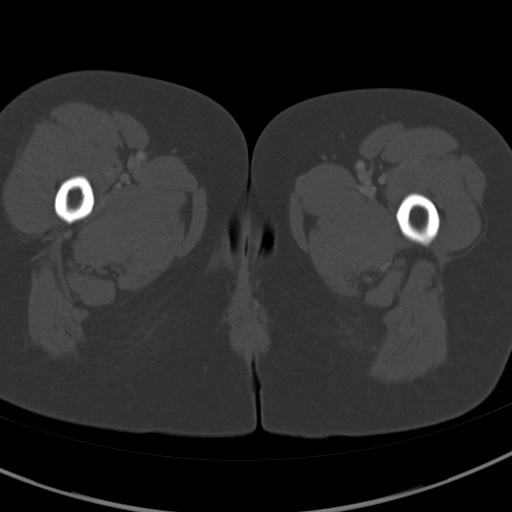
[im 11/85  soft-tissue]
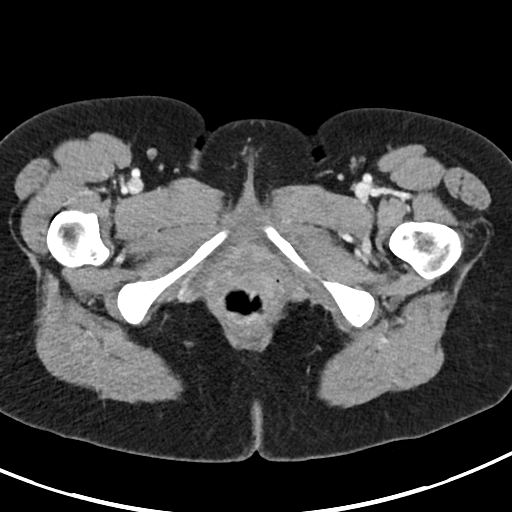
[im 18/85  soft-tissue]
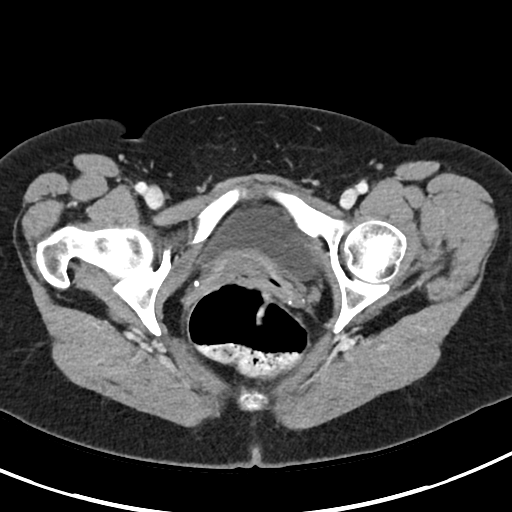
[im 25/85  soft-tissue]
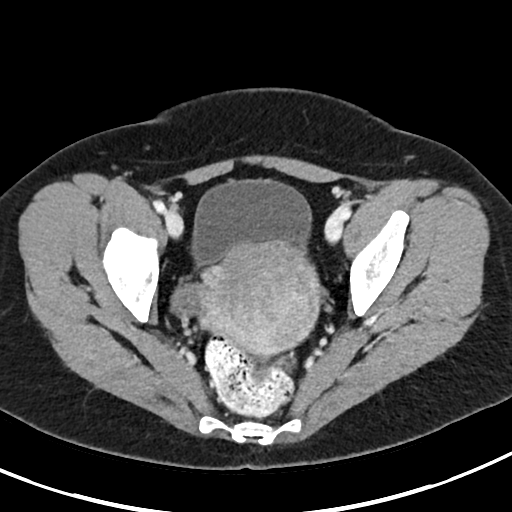
[im 29/85  soft-tissue]
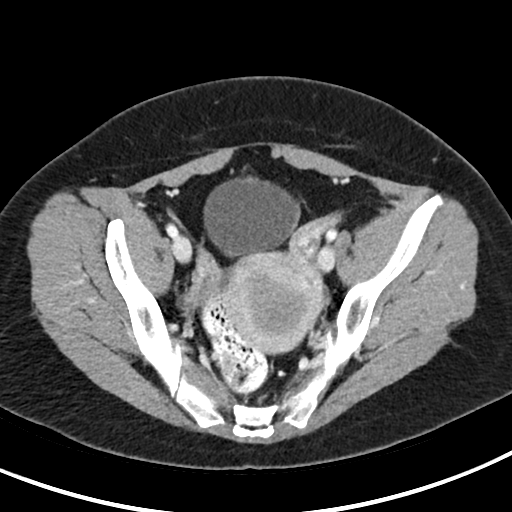
[im 36/85  soft-tissue]
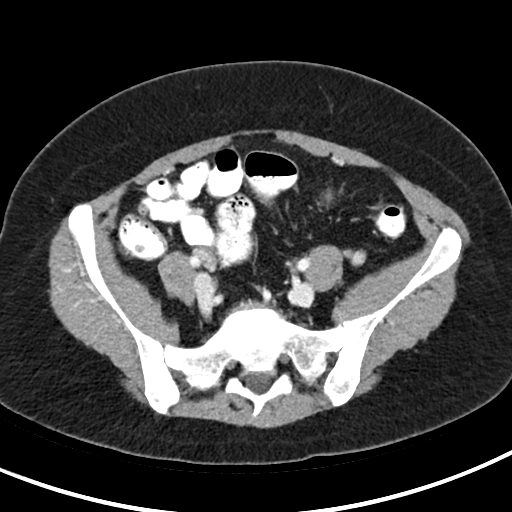
[im 43/85  soft-tissue]
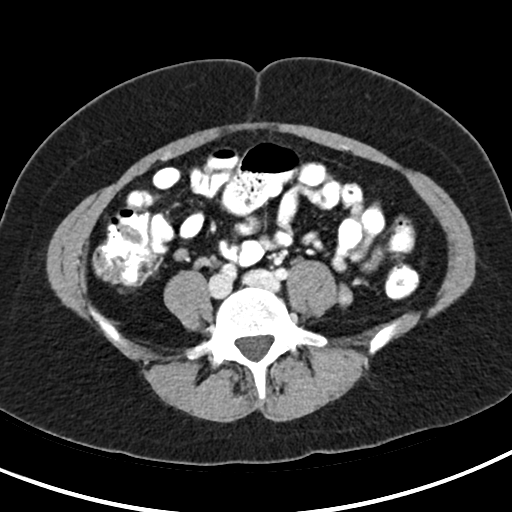
[im 50/85  soft-tissue]
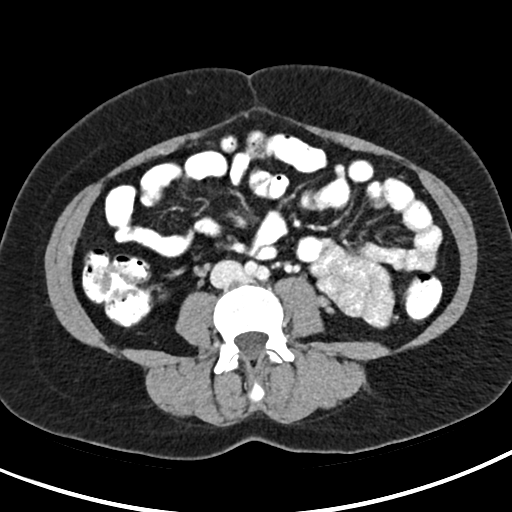
[im 57/85  soft-tissue]
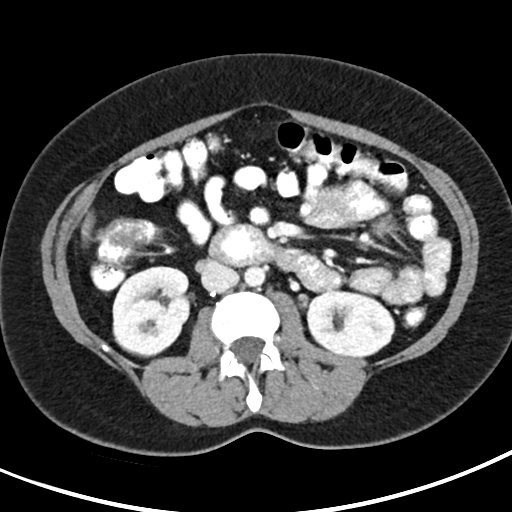
[im 57/85  bone]
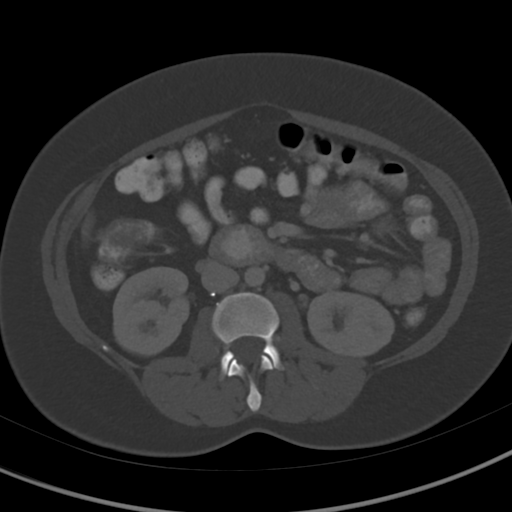
[im 60/85  soft-tissue]
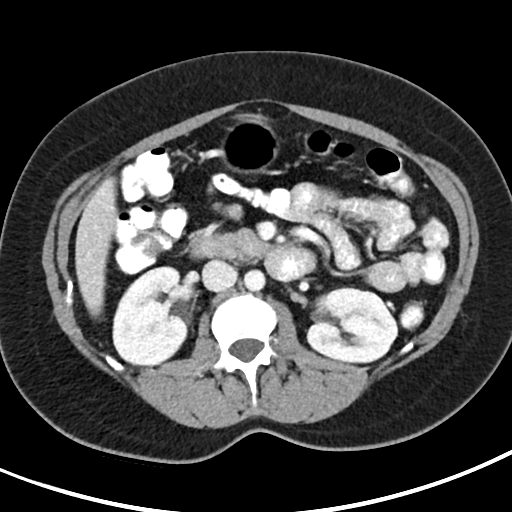
[im 67/85  soft-tissue]
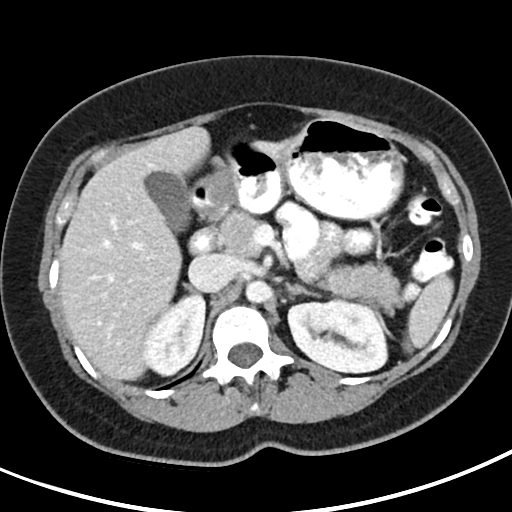
[im 74/85  soft-tissue]
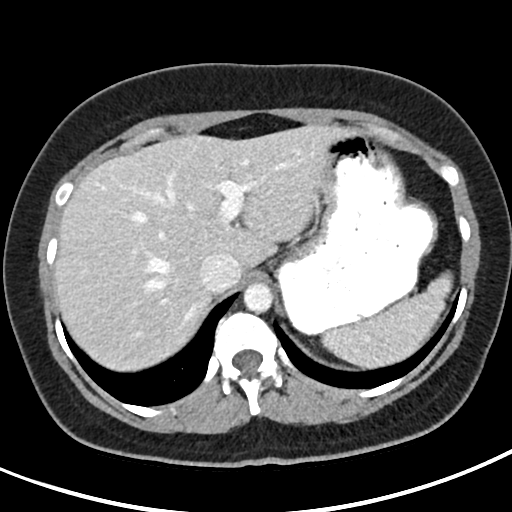
[im 81/85  soft-tissue]
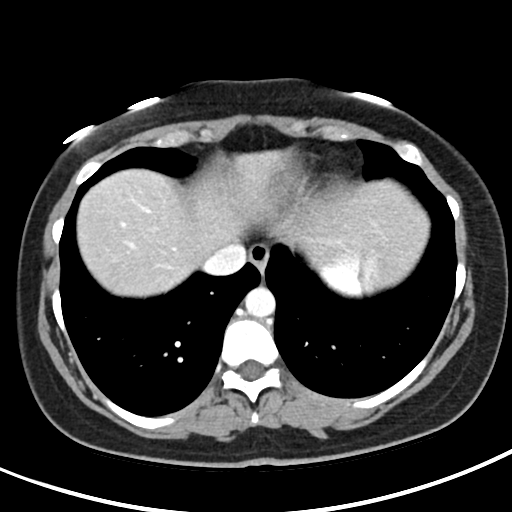

[Series 4: coronals abd pelvis 2.00 cor · coronal · 0.60mm/px · 3 of 128 slices shown]
[im 43/128  soft-tissue]
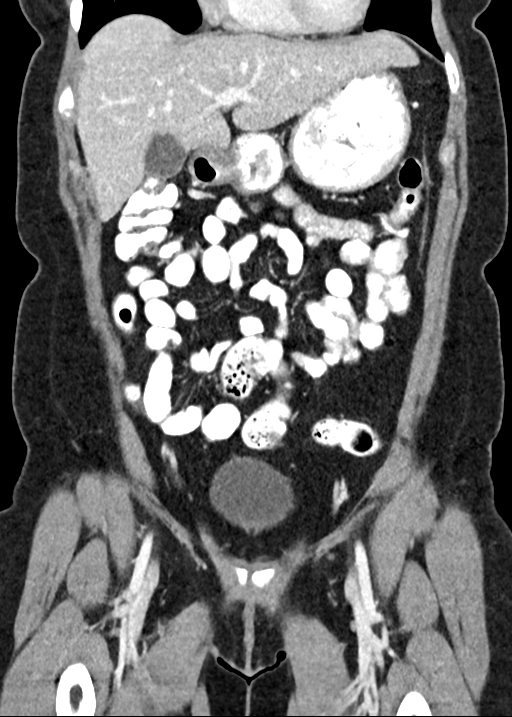
[im 57/128  soft-tissue]
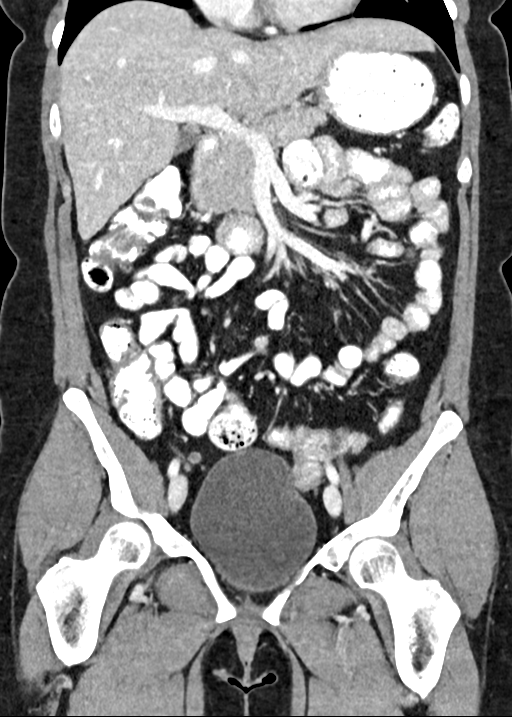
[im 71/128  soft-tissue]
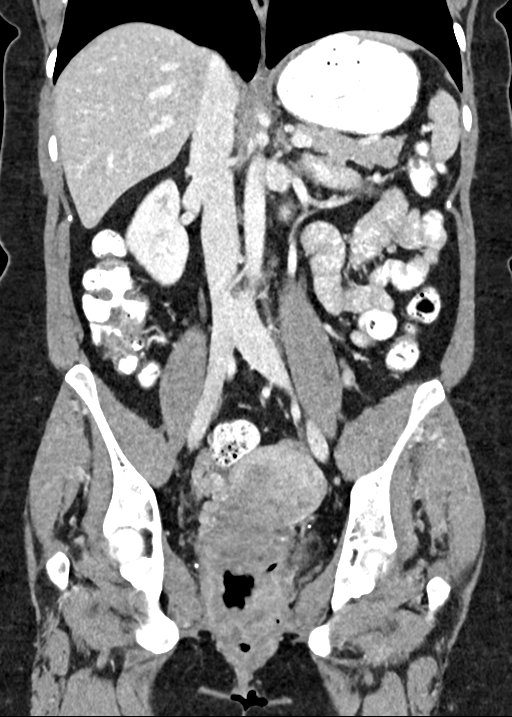

[16 of 46 positions shown; findings below may reference images not displayed]

FINDINGS: Lower chest: Lung bases are free of acute infiltrate or sizable
effusion.

Hepatobiliary: No focal liver abnormality is seen. No gallstones,
gallbladder wall thickening, or biliary dilatation.

Pancreas: Unremarkable. No pancreatic ductal dilatation or
surrounding inflammatory changes.

Spleen: Normal in size without focal abnormality.

Adrenals/Urinary Tract: Adrenal glands are within normal limits.
Kidneys demonstrate a normal enhancement pattern bilaterally. No
renal calculi or obstructive changes are seen. The ureters are
within normal limits bilaterally. The bladder is partially
distended.

Stomach/Bowel: The appendix is well visualized and within normal
limits. No obstructive or inflammatory changes of the large or small
bowel are seen. The stomach is within normal limits.

Vascular/Lymphatic: No significant vascular findings are present. No
enlarged abdominal or pelvic lymph nodes.

Reproductive: Uterus is retroflexed. Ovarian cystic changes are
seen.

Other: No free fluid is noted. Postsurgical changes are noted in the
retroperitoneum adjacent to the right kidney and IVC consistent with
the given clinical history. No free fluid or focal hernia is noted.

Musculoskeletal: No acute or significant osseous findings.
IMPRESSION: Postsurgical change consistent with the given clinical history.

No acute abnormality noted.

## 2022-09-23 DIAGNOSIS — N920 Excessive and frequent menstruation with regular cycle: Secondary | ICD-10-CM | POA: Diagnosis not present

## 2022-09-23 DIAGNOSIS — Z01411 Encounter for gynecological examination (general) (routine) with abnormal findings: Secondary | ICD-10-CM | POA: Diagnosis not present

## 2022-09-23 DIAGNOSIS — R87610 Atypical squamous cells of undetermined significance on cytologic smear of cervix (ASC-US): Secondary | ICD-10-CM | POA: Diagnosis not present

## 2022-09-23 DIAGNOSIS — N760 Acute vaginitis: Secondary | ICD-10-CM | POA: Diagnosis not present

## 2022-09-23 DIAGNOSIS — Z124 Encounter for screening for malignant neoplasm of cervix: Secondary | ICD-10-CM | POA: Diagnosis not present

## 2022-09-23 DIAGNOSIS — Z1331 Encounter for screening for depression: Secondary | ICD-10-CM | POA: Diagnosis not present

## 2024-01-26 ENCOUNTER — Other Ambulatory Visit: Payer: Self-pay

## 2024-01-26 ENCOUNTER — Other Ambulatory Visit: Payer: Self-pay | Admitting: Certified Nurse Midwife

## 2024-01-26 DIAGNOSIS — N6452 Nipple discharge: Secondary | ICD-10-CM

## 2024-01-26 DIAGNOSIS — Z803 Family history of malignant neoplasm of breast: Secondary | ICD-10-CM

## 2024-02-01 ENCOUNTER — Other Ambulatory Visit: Payer: Self-pay

## 2024-02-01 ENCOUNTER — Ambulatory Visit
Admission: RE | Admit: 2024-02-01 | Discharge: 2024-02-01 | Disposition: A | Discharge status: Home / Self Care | Payer: Self-pay | Source: Ambulatory Visit | Attending: Obstetrics and Gynecology | Admitting: Obstetrics and Gynecology

## 2024-02-01 ENCOUNTER — Ambulatory Visit: Payer: Self-pay | Attending: Obstetrics and Gynecology | Admitting: *Deleted

## 2024-02-01 VITALS — Ht 59.0 in | Wt 171.9 lb

## 2024-02-01 DIAGNOSIS — N6452 Nipple discharge: Secondary | ICD-10-CM | POA: Insufficient documentation

## 2024-02-01 DIAGNOSIS — Z803 Family history of malignant neoplasm of breast: Secondary | ICD-10-CM

## 2024-02-01 DIAGNOSIS — Z1239 Encounter for other screening for malignant neoplasm of breast: Secondary | ICD-10-CM

## 2024-02-01 NOTE — Progress Notes (Signed)
 Ms. Tanya Conley is a 38 y.o. female who presents to [facility] clinic today with complaint of spontaneous yellowish to clear left breast discharge x one month. Patient had breast discharge cultures completed 01/26/2024 that showed scant growth and a prolactin level completed.   Pap Smear: Pap smear not completed today. Last Pap smear was 09/23/2022 at [facility] clinic and was abnormal - ASCUS with negative HPV. Per patient has no history of an abnormal Pap smear prior to her most recent Pap smear. Last Pap smear result is available in Epic.   Physical exam: Breasts Breasts symmetrical. No skin abnormalities bilateral breasts. No nipple retraction bilateral breasts. No nipple discharge right breast. Expressed a clear nipple discharge from left breast on exam. No lymphadenopathy. No lumps palpated bilateral breasts. No complaints of pain or tenderness on exam.       Pelvic/Bimanual Pap is not indicated today per BCCCP guidelines.   Smoking History: Patient has never smoked.   Patient Navigation: Patient education provided. Access to services provided for patient through BCCCP program.    Breast and Cervical Cancer Risk Assessment: Patient has family history of her mother having breast cancer. Patient has no known genetic mutations or history of radiation treatment to the chest before age 29. Patient does not have history of cervical dysplasia, immunocompromised, or DES exposure in-utero.  Risk Scores as of Encounter on 02/01/2024     Tanya Conley           5-year 0.84%   Lifetime 18.04%   This patient is [place]/Lat[place] but has no documented birth country, so the [place] model used data from [place] patients to calculate their risk score. Document a birth country in the Demographics activity for a more accurate score.         Last calculated by Rogerio Tempie SQUIBB, LPN on 2/71/7974 at 10:09 AM        A: BCCCP exam without pap smear Complaint of left breast discharge.  P: Referred patient to the  [facility] for a diagnostic mammogram. Appointment scheduled Monday, February 01, 2024 at 1040.  Driscilla Wanda SQUIBB, RN 02/01/2024 10:24 AM

## 2024-02-01 NOTE — Patient Instructions (Addendum)
 Explained breast self awareness with Tanya Conley. Patient did not need a Pap smear today due to last Pap smear was 09/23/2022. Let her know that based on her result that her next Pap smear will be due in 3 years. Referred patient to the [facility] for a diagnostic mammogram. Appointment scheduled Monday, February 01, 2024 at 1040. Patient aware of appointment and will be there. Tanya Conley verbalized understanding.  Tanya Conley, Tanya Ship, RN 10:24 AM

## 2024-02-02 ENCOUNTER — Other Ambulatory Visit: Payer: Self-pay

## 2024-02-02 ENCOUNTER — Other Ambulatory Visit: Payer: Self-pay | Admitting: Obstetrics and Gynecology

## 2024-02-02 DIAGNOSIS — R928 Other abnormal and inconclusive findings on diagnostic imaging of breast: Secondary | ICD-10-CM

## 2024-02-03 ENCOUNTER — Ambulatory Visit
Admission: RE | Admit: 2024-02-03 | Discharge: 2024-02-03 | Disposition: A | Discharge status: Home / Self Care | Payer: Self-pay | Source: Ambulatory Visit | Attending: Obstetrics and Gynecology | Admitting: Obstetrics and Gynecology

## 2024-02-03 ENCOUNTER — Ambulatory Visit: Payer: Self-pay

## 2024-02-03 DIAGNOSIS — D242 Benign neoplasm of left breast: Secondary | ICD-10-CM | POA: Insufficient documentation

## 2024-02-03 DIAGNOSIS — R928 Other abnormal and inconclusive findings on diagnostic imaging of breast: Secondary | ICD-10-CM | POA: Insufficient documentation

## 2024-02-03 HISTORY — PX: BREAST BIOPSY: SHX20

## 2024-02-03 MED ORDER — LIDOCAINE 1 % OPTIME INJ - NO CHARGE
1.0000 mL | Freq: Once | INTRAMUSCULAR | Status: AC
  Administered 2024-02-03: 1 mL
  Filled 2024-02-03: qty 2

## 2024-02-03 MED ORDER — LIDOCAINE-EPINEPHRINE 1 %-1:100000 IJ SOLN
5.0000 mL | Freq: Once | INTRAMUSCULAR | Status: AC
  Administered 2024-02-03: 5 mL

## 2024-02-04 LAB — SURGICAL PATHOLOGY

## 2024-02-05 ENCOUNTER — Encounter: Payer: Self-pay | Admitting: *Deleted

## 2024-02-05 DIAGNOSIS — D242 Benign neoplasm of left breast: Secondary | ICD-10-CM

## 2024-02-05 HISTORY — DX: Benign neoplasm of left breast: D24.2

## 2024-02-11 ENCOUNTER — Ambulatory Visit (INDEPENDENT_AMBULATORY_CARE_PROVIDER_SITE_OTHER): Payer: Self-pay | Admitting: General Surgery

## 2024-02-11 ENCOUNTER — Other Ambulatory Visit: Payer: Self-pay | Admitting: General Surgery

## 2024-02-11 ENCOUNTER — Other Ambulatory Visit: Payer: Self-pay | Admitting: Obstetrics and Gynecology

## 2024-02-11 ENCOUNTER — Ambulatory Visit: Payer: Self-pay | Admitting: General Surgery

## 2024-02-11 ENCOUNTER — Encounter: Payer: Self-pay | Admitting: General Surgery

## 2024-02-11 VITALS — BP 130/74 | HR 78 | Temp 98.1°F | Ht 59.0 in | Wt 173.0 lb

## 2024-02-11 DIAGNOSIS — D369 Benign neoplasm, unspecified site: Secondary | ICD-10-CM

## 2024-02-11 DIAGNOSIS — D242 Benign neoplasm of left breast: Secondary | ICD-10-CM

## 2024-02-11 NOTE — Patient Instructions (Addendum)
 We have spoken today about removing a lump in your breast. This will be done by Dr. Marinda at [facility].  You will most likely be able to leave the hospital several hours after your surgery. Rarely, a patient needs to stay over night but this is a possibility.  Plan to tentatively be off work for 1-2 weeks following the surgery and may return with approximately 2 more weeks of a lifting restriction, no greater than 15 lbs.  Please see your Blue surgery sheet for more information. Our surgery scheduler will call you to look at surgery dates and to go over information.   If you have FMLA or Disability paperwork that needs to be filled out, please have your company fax your paperwork to [phone] or you may drop this by either office. This paperwork will be filled out within 3 days after your surgery has been completed.  What is radio frequency localization of the breast?(RFID) RFID/Scout tag localization uses radiofrequency technology to accurately pinpoint the tumor. Seeing exactly where the tumor is before surgery helps surgeons more effectively remove the entire tumor and spare surrounding healthy breast tissue.    Lumpectomy A lumpectomy is a form of breast conserving or breast preservation surgery. During a lumpectomy, the portion of the breast that contains the cancerous tumor or breast mass (the lump) is removed. Some normal tissue around the lump may also be removed to make sure all of the tumor has been removed.  LET [facility] CARE PROVIDER KNOW ABOUT: Any allergies you have. All medicines you are taking, including vitamins, herbs, eye drops, creams, and over-the-counter medicines. Previous problems you or members of your family have had with the use of anesthetics. Any blood disorders you have. Previous surgeries you have had. Medical conditions you have. RISKS AND COMPLICATIONS Generally, this is a safe procedure. However, problems can occur and  include: Bleeding. Infection. Pain. Temporary swelling. Change in the shape of the breast, particularly if a large portion is removed. BEFORE THE PROCEDURE Ask your health care provider about changing or stopping your regular medicines. This is especially important if you are taking diabetes medicines or blood thinners. Do not eat or drink anything after midnight on the night before the procedure or as directed by your health care provider. Ask your health care provider if you can take a sip of water with any approved medicines. On the day of surgery, your health care provider will use a mammogram or ultrasound to locate and mark the tumor in your breast. These markings on your breast will show where the cut (incision) will be made.   PROCEDURE  An IV tube will be put into one of your veins. You may be given medicine to help you relax before the surgery (sedative). You will be given one of the following: A medicine that numbs the area (local anesthetic). A medicine that makes you fall asleep (general anesthetic). Your health care provider will use a kind of electric scalpel that uses heat to minimize bleeding (electrocautery knife). A curved incision (like a smile or frown) that follows the natural curve of your breast is made, to allow for minimal scarring and better healing. The tumor will be removed with some of the surrounding tissue. This will be sent to the lab for analysis. Your health care provider may also remove your lymph nodes at this time if needed. Sometimes, but not always, a rubber tube called a drain will be surgically inserted into your breast area or armpit to  collect excess fluid that may accumulate in the space where the tumor was. This drain is connected to a plastic bulb on the outside of your body. This drain creates suction to help remove the fluid. The incisions will be closed with stitches (sutures). A bandage may be placed over the incisions. AFTER THE  PROCEDURE You will be taken to the recovery area. You will be given medicine for pain. A small rubber drain may be placed in the breast for 2-3 days to prevent a collection of blood (hematoma) from developing in the breast. You will be given instructions on caring for the drain before you go home. A pressure bandage (dressing) will be applied for 1-2 days to prevent bleeding. Ask your health care provider how to care for your bandage at home.   This information is not intended to replace advice given to you by your health care provider. Make sure you discuss any questions you have with your health care provider.   Document Released: 08/04/2006 Document Revised: 07/14/2014 Document Reviewed: 11/26/2012 Elsevier Interactive Patient Education [employer]

## 2024-02-11 NOTE — Progress Notes (Signed)
 Patient ID: Tanya Conley, female   DOB: 12-02-1985, 38 y.o.   MRN: [id number] CC: Left Breast Intraductal papilloma  History of Present Illness Tanya Conley is a 38 y.o. 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  The patient reports that several weeks ago she noticed some spontaneous yellow discharge from her left breast.  She says that she would also be able to express the discharge when she palpated around her nipple.  She presented to her PCP for evaluation and was referred for diagnostic mammogram.  She underwent a diagnostic mammogram that was consistent with a BI-RADS 4 and it was recommended that she undergo biopsy.  She completed a biopsy of the left breast lesion that was consistent pathology with intraductal papilloma.  She says that since then the discharge from her left nipple has stopped.  She denies any overlying skin changes, redness or palpable lumps.  She also denies any pain or bleeding from her nipple.  She has a significant family history of breast cancer in her mother.  Her mother was diagnosed at age 57 she thinks and died shortly thereafter.  She is a G3, P3 and was 18 at the age of her first pregnancy.  She does report that she breast-feeds.  She has not felt the lump.  She has never undergone genetic testing.  But she denies any other family history of cancers..  Past Medical History Past Medical History:  Diagnosis Date   Kidney stone        Past Surgical History:  Procedure Laterality Date   BREAST BIOPSY Left 02/03/2024   US  LT BREAST BX W LOC DEV 1ST LESION IMG BX SPEC US  GUIDE 02/03/2024 [facility]   CESAREAN SECTION     LITHOTRIPSY      No Known Allergies  No current outpatient medications on file.   No current facility-administered medications for this visit.    Family History Family History  Problem Relation Age of Onset   Breast cancer Mother 28       Social History Social  History   Tobacco Use   Smoking status: Never    Passive exposure: Past   Smokeless tobacco: Never  Vaping Use   Vaping status: Never Used  Substance Use Topics   Alcohol use: Never   Drug use: Never        ROS Full ROS of systems performed and is otherwise negative there than what is stated in the HPI  Physical Exam Blood pressure 130/74, pulse 78, temperature 98.1 F (36.7 C), height 4' 11 (1.499 m), weight 173 lb (78.5 kg), last menstrual period 01/21/2024, SpO2 99%.  Alert and oriented x 3, normal work of breathing on room air, regular rate and rhythm, abdomen soft, nontender nondistended, breast exam performed in the presence of a chaperone.  On the right breast there is no axillary lymphadenopathy.  No dominant lesions or skin changes on the right breast.  No nipple discharge or inversion on the right breast.  On the left breast there is no left axillary lymphadenopathy.  There is Band-Aid over the left breast that was removed today.  The area of biopsy has healed well without any bruising, no dominant masses or skin changes.  I was unable to express any discharge from her nipple. Data Reviewed I have independently reviewed her mammogram.  At approximately 12:00 posterior to the nipple there is an area of concern on the diagnostic mammogram and there  is an area of biopsy clip.  I have also independently reviewed her pathology was consistent with an intraductal papilloma.  I have personally reviewed the patient's imaging and medical records.    Assessment/Plan    38 year old female who had spontaneous nipple discharge and mammogram that was concerning.  She had a biopsy that showed intraductal papilloma.  I discussed the recommendations especially given her age and history of excision of intraductal papilloma.  We discussed the risk of upgrade to DCIS or invasive cancer.  I discussed surgery including excisional biopsy with [facility] scout.  The risks benefits and alternatives of the  procedure were talked about to her.  These risks include risk of infection, bleeding, seroma formation, wound breakdown, need for additional surgery and poor cosmetic outcomes.  She understands these risks and wishes to proceed with surgery.  Will have a Savi scout placed.  A total of 45 minutes was spent reviewing the patient's chart, performing history and physical and discussing treatment options with the patient.   Jayson MALVA Endow 02/11/2024, 2:56 PM

## 2024-02-12 ENCOUNTER — Telehealth: Payer: Self-pay | Admitting: General Surgery

## 2024-02-12 NOTE — Telephone Encounter (Signed)
 Patient has been advised of Pre-Admission date/time, and Surgery date at [facility].  Surgery Date: 02/22/24 Preadmission Testing Date: 02/17/24 (phone 1p-4p)  Patient informed of the scheduling process and surgery information given at time of office visit.  Patient has been made aware to call [phone], between 1-3:00pm the day before surgery, to find out what time to arrive for surgery.

## 2024-02-16 ENCOUNTER — Ambulatory Visit
Admission: RE | Admit: 2024-02-16 | Discharge: 2024-02-16 | Disposition: A | Discharge status: Home / Self Care | Payer: Self-pay | Source: Ambulatory Visit | Attending: General Surgery | Admitting: General Surgery

## 2024-02-16 DIAGNOSIS — D242 Benign neoplasm of left breast: Secondary | ICD-10-CM | POA: Insufficient documentation

## 2024-02-16 HISTORY — PX: BREAST BIOPSY: SHX20

## 2024-02-16 MED ORDER — LIDOCAINE HCL 1 % IJ SOLN
5.0000 mL | Freq: Once | INTRAMUSCULAR | Status: AC
  Administered 2024-02-16 (×2): 5 mL
  Filled 2024-02-16: qty 5

## 2024-02-17 ENCOUNTER — Other Ambulatory Visit: Payer: Self-pay

## 2024-02-17 ENCOUNTER — Encounter
Admission: RE | Admit: 2024-02-17 | Discharge: 2024-02-17 | Disposition: A | Discharge status: Home / Self Care | Payer: Self-pay | Source: Ambulatory Visit | Attending: General Surgery | Admitting: General Surgery

## 2024-02-17 VITALS — Ht 59.0 in | Wt 172.0 lb

## 2024-02-17 DIAGNOSIS — Z01810 Encounter for preprocedural cardiovascular examination: Secondary | ICD-10-CM

## 2024-02-17 DIAGNOSIS — I341 Nonrheumatic mitral (valve) prolapse: Secondary | ICD-10-CM

## 2024-02-17 DIAGNOSIS — Z01812 Encounter for preprocedural laboratory examination: Secondary | ICD-10-CM

## 2024-02-17 DIAGNOSIS — I10 Essential (primary) hypertension: Secondary | ICD-10-CM

## 2024-02-17 DIAGNOSIS — D242 Benign neoplasm of left breast: Secondary | ICD-10-CM

## 2024-02-17 DIAGNOSIS — E669 Obesity, unspecified: Secondary | ICD-10-CM

## 2024-02-17 HISTORY — DX: Personal history of other diseases of the circulatory system: Z86.79

## 2024-02-17 HISTORY — DX: Other specified postprocedural states: Z98.890

## 2024-02-17 HISTORY — DX: Palpitations: R00.2

## 2024-02-17 HISTORY — DX: Gastro-esophageal reflux disease without esophagitis: K21.9

## 2024-02-17 HISTORY — DX: Essential (primary) hypertension: I10

## 2024-02-17 HISTORY — DX: Excessive and frequent menstruation with regular cycle: N92.0

## 2024-02-17 NOTE — Patient Instructions (Addendum)
 Your procedure is scheduled on: Monday, August 18 Report to the Registration Desk on the 1st floor of the [facility] To find out your arrival time, please call [phone] between 1PM - 3PM on: Friday, August 15 If your arrival time is 6:00 am, do not arrive before that time as the [facility] entrance doors do not open until 6:00 am.  REMEMBER: Instructions that are not followed completely may result in serious medical risk, up to and including death; or upon the discretion of your surgeon and anesthesiologist your surgery may need to be rescheduled.  Do not eat or drink after midnight the night before surgery.  No gum chewing or hard candies.  One week prior to surgery:  Stop Anti-inflammatories (NSAIDS) such as Advil, Aleve, Ibuprofen, Motrin, Naproxen, Naprosyn and Aspirin based products such as Excedrin, Goody's Powder, BC Powder. Stop ANY OVER THE COUNTER supplements until after surgery.  You may however, continue to take Tylenol  if needed for pain up until the day of surgery.  ON THE DAY OF SURGERY DO NOT TAKE ANY MEDICATIONS   No Alcohol for 24 hours before or after surgery.  No Smoking including e-cigarettes for 24 hours before surgery.  No chewable tobacco products for at least 6 hours before surgery.  No nicotine patches on the day of surgery.  Do not use any recreational drugs for at least a week (preferably 2 weeks) before your surgery.  Please be advised that the combination of cocaine and anesthesia may have negative outcomes, up to and including death. If you test positive for cocaine, your surgery will be cancelled.  On the morning of surgery brush your teeth with toothpaste and water, you may rinse your mouth with mouthwash if you wish. Do not swallow any toothpaste or mouthwash.  Use CHG Soap as directed on instruction sheet.  Do not wear jewelry, make-up, hairpins, clips or nail polish.  For welded (permanent) jewelry: bracelets, anklets, waist bands,  etc.  Please have this removed prior to surgery.  If it is not removed, there is a chance that hospital personnel will need to cut it off on the day of surgery.  Do not wear lotions, powders, or perfumes.   Do not shave body hair from the neck down 48 hours before surgery.  Contact lenses, hearing aids and dentures may not be worn into surgery.  Do not bring valuables to the hospital. [facility] is not responsible for any missing/lost belongings or valuables.   Notify your doctor if there is any change in your medical condition (cold, fever, infection).  Wear comfortable clothing (specific to your surgery type) to the hospital.  After surgery, you can help prevent lung complications by doing breathing exercises.  Take deep breaths and cough every 1-2 hours.   If you are being discharged the day of surgery, you will not be allowed to drive home. You will need a responsible individual to drive you home and stay with you for 24 hours after surgery.   If you are taking public transportation, you will need to have a responsible individual with you.  Please call the Pre-admissions Testing Dept. at [phone] if you have any questions about these instructions.  Surgery Visitation Policy:  Patients having surgery or a procedure may have two visitors.  Children under the age of 37 must have an adult with them who is not the patient.   Merchandiser, retail to address health-related social needs:  [email or web address]      Preparing  for Surgery with CHLORHEXIDINE  GLUCONATE (CHG) Soap  Chlorhexidine  Gluconate (CHG) Soap  o An antiseptic cleaner that kills germs and bonds with the skin to continue killing germs even after washing  o Used for showering the night before surgery and morning of surgery  Before surgery, you can play an important role by reducing the number of germs on your skin.  CHG (Chlorhexidine  gluconate) soap is an antiseptic cleanser which  kills germs and bonds with the skin to continue killing germs even after washing.  Please do not use if you have an allergy to CHG or antibacterial soaps. If your skin becomes reddened/irritated stop using the CHG.  1. Shower the NIGHT BEFORE SURGERY and the MORNING OF SURGERY with CHG soap.  2. If you choose to wash your hair, wash your hair first as usual with your normal shampoo.  3. After shampooing, rinse your hair and body thoroughly to remove the shampoo.  4. Use CHG as you would any other liquid soap. You can apply CHG directly to the skin and wash gently with a scrungie or a clean washcloth.  5. Apply the CHG soap to your body only from the neck down. Do not use on open wounds or open sores. Avoid contact with your eyes, ears, mouth, and genitals (private parts). Wash face and genitals (private parts) with your normal soap.  6. Wash thoroughly, paying special attention to the area where your surgery will be performed.  7. Thoroughly rinse your body with warm water.  8. Do not shower/wash with your normal soap after using and rinsing off the CHG soap.  9. Pat yourself dry with a clean towel.  10. Wear clean pajamas to bed the night before surgery.  12. Place clean sheets on your bed the night of your first shower and do not sleep with pets.  13. Shower again with the CHG soap on the day of surgery prior to arriving at the hospital.  14. Do not apply any deodorants/lotions/powders.  15. Please wear clean clothes to the hospital.

## 2024-02-18 ENCOUNTER — Encounter
Admission: RE | Admit: 2024-02-18 | Discharge: 2024-02-18 | Disposition: A | Discharge status: Home / Self Care | Payer: Self-pay | Source: Ambulatory Visit | Attending: General Surgery | Admitting: General Surgery

## 2024-02-18 DIAGNOSIS — I10 Essential (primary) hypertension: Secondary | ICD-10-CM | POA: Insufficient documentation

## 2024-02-18 DIAGNOSIS — Z01810 Encounter for preprocedural cardiovascular examination: Secondary | ICD-10-CM

## 2024-02-18 DIAGNOSIS — E669 Obesity, unspecified: Secondary | ICD-10-CM | POA: Insufficient documentation

## 2024-02-18 DIAGNOSIS — Z01812 Encounter for preprocedural laboratory examination: Secondary | ICD-10-CM

## 2024-02-18 DIAGNOSIS — I341 Nonrheumatic mitral (valve) prolapse: Secondary | ICD-10-CM | POA: Insufficient documentation

## 2024-02-18 DIAGNOSIS — Z01818 Encounter for other preprocedural examination: Secondary | ICD-10-CM | POA: Insufficient documentation

## 2024-02-18 LAB — CBC
HCT: 37.4 % (ref 36.0–46.0)
Hemoglobin: 12.4 g/dL (ref 12.0–15.0)
MCH: 31.2 pg (ref 26.0–34.0)
MCHC: 33.2 g/dL (ref 30.0–36.0)
MCV: 94 fL (ref 80.0–100.0)
Platelets: 275 K/uL (ref 150–400)
RBC: 3.98 MIL/uL (ref 3.87–5.11)
RDW: 12.4 % (ref 11.5–15.5)
WBC: 9.7 K/uL (ref 4.0–10.5)
nRBC: 0 % (ref 0.0–0.2)

## 2024-02-22 ENCOUNTER — Other Ambulatory Visit: Payer: Self-pay

## 2024-02-22 ENCOUNTER — Encounter
Admission: RE | Disposition: A | Discharge status: Home / Self Care | Payer: Self-pay | Source: Home / Self Care | Attending: General Surgery

## 2024-02-22 ENCOUNTER — Ambulatory Visit: Payer: MEDICAID | Admitting: Anesthesiology

## 2024-02-22 ENCOUNTER — Encounter: Payer: Self-pay | Admitting: General Surgery

## 2024-02-22 ENCOUNTER — Ambulatory Visit
Admission: RE | Admit: 2024-02-22 | Discharge: 2024-02-22 | Disposition: A | Discharge status: Home / Self Care | Payer: MEDICAID | Source: Ambulatory Visit | Attending: General Surgery | Admitting: General Surgery

## 2024-02-22 ENCOUNTER — Ambulatory Visit: Payer: MEDICAID | Admitting: Urgent Care

## 2024-02-22 ENCOUNTER — Ambulatory Visit
Admission: RE | Admit: 2024-02-22 | Discharge: 2024-02-22 | Disposition: A | Discharge status: Home / Self Care | Payer: MEDICAID | Attending: General Surgery | Admitting: General Surgery

## 2024-02-22 DIAGNOSIS — Z01812 Encounter for preprocedural laboratory examination: Secondary | ICD-10-CM

## 2024-02-22 DIAGNOSIS — N6452 Nipple discharge: Secondary | ICD-10-CM | POA: Insufficient documentation

## 2024-02-22 DIAGNOSIS — D242 Benign neoplasm of left breast: Secondary | ICD-10-CM | POA: Insufficient documentation

## 2024-02-22 DIAGNOSIS — Z803 Family history of malignant neoplasm of breast: Secondary | ICD-10-CM | POA: Insufficient documentation

## 2024-02-22 DIAGNOSIS — K219 Gastro-esophageal reflux disease without esophagitis: Secondary | ICD-10-CM | POA: Insufficient documentation

## 2024-02-22 DIAGNOSIS — N6082 Other benign mammary dysplasias of left breast: Secondary | ICD-10-CM | POA: Insufficient documentation

## 2024-02-22 DIAGNOSIS — D369 Benign neoplasm, unspecified site: Secondary | ICD-10-CM

## 2024-02-22 HISTORY — PX: EXCISION OF BREAST BIOPSY: SHX5822

## 2024-02-22 LAB — POCT PREGNANCY, URINE: Preg Test, Ur: NEGATIVE

## 2024-02-22 SURGERY — EXCISION OF BREAST BIOPSY
Anesthesia: General | Laterality: Left

## 2024-02-22 MED ORDER — CEFAZOLIN SODIUM-DEXTROSE 2-4 GM/100ML-% IV SOLN
INTRAVENOUS | Status: AC
Start: 2024-02-22 — End: 2024-02-22
  Filled 2024-02-22: qty 100

## 2024-02-22 MED ORDER — CHLORHEXIDINE GLUCONATE CLOTH 2 % EX PADS: 6.0000 | MEDICATED_PAD | Freq: Once | CUTANEOUS | Status: DC

## 2024-02-22 MED ORDER — ACETAMINOPHEN 10 MG/ML IV SOLN
INTRAVENOUS | Status: AC
  Filled 2024-02-22: qty 100

## 2024-02-22 MED ORDER — MIDAZOLAM HCL 2 MG/2ML IJ SOLN
INTRAMUSCULAR | Status: AC
  Filled 2024-02-22: qty 2

## 2024-02-22 MED ORDER — LIDOCAINE HCL (CARDIAC) PF 100 MG/5ML IV SOSY
PREFILLED_SYRINGE | INTRAVENOUS | Status: DC | PRN
  Administered 2024-02-22: 100 mg via INTRAVENOUS

## 2024-02-22 MED ORDER — CHLORHEXIDINE GLUCONATE 0.12 % MT SOLN
OROMUCOSAL | Status: AC
  Filled 2024-02-22: qty 15

## 2024-02-22 MED ORDER — CHLORHEXIDINE GLUCONATE 0.12 % MT SOLN
15.0000 mL | Freq: Once | OROMUCOSAL | Status: AC
  Administered 2024-02-22: 15 mL via OROMUCOSAL

## 2024-02-22 MED ORDER — PROPOFOL 10 MG/ML IV BOLUS
INTRAVENOUS | Status: DC | PRN
  Administered 2024-02-22: 125 ug/kg/min via INTRAVENOUS
  Administered 2024-02-22: 200 mg via INTRAVENOUS

## 2024-02-22 MED ORDER — FENTANYL CITRATE (PF) 100 MCG/2ML IJ SOLN
INTRAMUSCULAR | Status: DC | PRN
  Administered 2024-02-22: 50 ug via INTRAVENOUS
  Administered 2024-02-22 (×2): 25 ug via INTRAVENOUS

## 2024-02-22 MED ORDER — OXYCODONE HCL 5 MG PO TABS: 5.0000 mg | ORAL_TABLET | Freq: Four times a day (QID) | ORAL | 0 refills | Status: DC | PRN

## 2024-02-22 MED ORDER — BUPIVACAINE-EPINEPHRINE (PF) 0.5% -1:200000 IJ SOLN
INTRAMUSCULAR | Status: DC | PRN
  Administered 2024-02-22: 30 mL

## 2024-02-22 MED ORDER — DEXAMETHASONE SODIUM PHOSPHATE 10 MG/ML IJ SOLN
INTRAMUSCULAR | Status: AC
  Filled 2024-02-22: qty 1

## 2024-02-22 MED ORDER — PROPOFOL 1000 MG/100ML IV EMUL
INTRAVENOUS | Status: AC
  Filled 2024-02-22: qty 100

## 2024-02-22 MED ORDER — ONDANSETRON HCL 4 MG/2ML IJ SOLN
INTRAMUSCULAR | Status: DC | PRN
  Administered 2024-02-22: 4 mg via INTRAVENOUS

## 2024-02-22 MED ORDER — FENTANYL CITRATE (PF) 100 MCG/2ML IJ SOLN
INTRAMUSCULAR | Status: AC
  Filled 2024-02-22: qty 2

## 2024-02-22 MED ORDER — LACTATED RINGERS IV SOLN: INTRAVENOUS | Status: DC | PRN

## 2024-02-22 MED ORDER — DEXAMETHASONE SODIUM PHOSPHATE 10 MG/ML IJ SOLN
INTRAMUSCULAR | Status: DC | PRN
  Administered 2024-02-22: 10 mg via INTRAVENOUS

## 2024-02-22 MED ORDER — ORAL CARE MOUTH RINSE
15.0000 mL | Freq: Once | OROMUCOSAL | Status: AC
Start: 2024-02-22 — End: 2024-02-22

## 2024-02-22 MED ORDER — ACETAMINOPHEN 10 MG/ML IV SOLN
INTRAVENOUS | Status: DC | PRN
  Administered 2024-02-22: 1000 mg via INTRAVENOUS

## 2024-02-22 MED ORDER — LIDOCAINE HCL (PF) 2 % IJ SOLN
INTRAMUSCULAR | Status: AC
  Filled 2024-02-22: qty 5

## 2024-02-22 MED ORDER — ONDANSETRON HCL 4 MG/2ML IJ SOLN
INTRAMUSCULAR | Status: AC
  Filled 2024-02-22: qty 2

## 2024-02-22 MED ORDER — FENTANYL CITRATE (PF) 100 MCG/2ML IJ SOLN: 25.0000 ug | INTRAMUSCULAR | Status: DC | PRN

## 2024-02-22 MED ORDER — LACTATED RINGERS IV SOLN: INTRAVENOUS | Status: DC

## 2024-02-22 MED ORDER — MIDAZOLAM HCL 2 MG/2ML IJ SOLN
INTRAMUSCULAR | Status: DC | PRN
  Administered 2024-02-22: 2 mg via INTRAVENOUS

## 2024-02-22 MED ORDER — OXYCODONE HCL 5 MG PO TABS: 5.0000 mg | ORAL_TABLET | Freq: Once | ORAL | Status: DC | PRN

## 2024-02-22 MED ORDER — KETOROLAC TROMETHAMINE 30 MG/ML IJ SOLN
INTRAMUSCULAR | Status: DC | PRN
  Administered 2024-02-22: 30 mg via INTRAVENOUS

## 2024-02-22 MED ORDER — CEFAZOLIN SODIUM-DEXTROSE 2-4 GM/100ML-% IV SOLN
2.0000 g | INTRAVENOUS | Status: AC
  Administered 2024-02-22: 2 g via INTRAVENOUS

## 2024-02-22 MED ORDER — BUPIVACAINE-EPINEPHRINE (PF) 0.5% -1:200000 IJ SOLN
INTRAMUSCULAR | Status: AC
  Filled 2024-02-22: qty 30

## 2024-02-22 MED ORDER — OXYCODONE HCL 5 MG/5ML PO SOLN: 5.0000 mg | Freq: Once | ORAL | Status: DC | PRN

## 2024-02-22 MED ORDER — PHENYLEPHRINE 80 MCG/ML (10ML) SYRINGE FOR IV PUSH (FOR BLOOD PRESSURE SUPPORT)
PREFILLED_SYRINGE | INTRAVENOUS | Status: DC | PRN
  Administered 2024-02-22 (×2): 160 ug via INTRAVENOUS

## 2024-02-22 SURGICAL SUPPLY — 27 items
BLADE SURG 15 STRL LF DISP TIS (BLADE) ×1 IMPLANT
CHLORAPREP W/TINT 26 (MISCELLANEOUS) IMPLANT
CNTNR URN SCR LID CUP LEK RST (MISCELLANEOUS) IMPLANT
DERMABOND ADVANCED .7 DNX12 (GAUZE/BANDAGES/DRESSINGS) ×1 IMPLANT
DEVICE DUBIN SPECIMEN MAMMOGRA (MISCELLANEOUS) ×1 IMPLANT
DRAPE LAPAROTOMY TRNSV 106X77 (MISCELLANEOUS) ×1 IMPLANT
DRAPE SHEET LG 3/4 BI-LAMINATE (DRAPES) IMPLANT
ELECTRODE REM PT RTRN 9FT ADLT (ELECTROSURGICAL) ×1 IMPLANT
GLOVE BIOGEL PI IND STRL 7.5 (GLOVE) ×1 IMPLANT
GLOVE SURG SYN 7.0 PF PI (GLOVE) ×1 IMPLANT
GOWN STRL REUS W/ TWL LRG LVL3 (GOWN DISPOSABLE) ×2 IMPLANT
KIT MARKER MARGIN INK (KITS) IMPLANT
KIT TURNOVER KIT A (KITS) ×1 IMPLANT
LABEL OR SOLS (LABEL) ×1 IMPLANT
MANIFOLD NEPTUNE II (INSTRUMENTS) ×1 IMPLANT
MARKER SKIN DUAL TIP RULER LAB (MISCELLANEOUS) ×1 IMPLANT
NDL HYPO 22X1.5 SAFETY MO (MISCELLANEOUS) ×1 IMPLANT
NEEDLE HYPO 22X1.5 SAFETY MO (MISCELLANEOUS) ×1 IMPLANT
PACK BASIN MINOR ARMC (MISCELLANEOUS) ×1 IMPLANT
SHEATH BREAST BIOPSY SKIN MKR (SHEATH) ×1 IMPLANT
SUT VIC AB 3-0 SH 27X BRD (SUTURE) ×1 IMPLANT
SUTURE MNCRL 4-0 27XMF (SUTURE) ×1 IMPLANT
SYR 10ML LL (SYRINGE) ×1 IMPLANT
SYR BULB IRRIG 60ML STRL (SYRINGE) ×1 IMPLANT
TRAP FLUID SMOKE EVACUATOR (MISCELLANEOUS) ×1 IMPLANT
TRAP NEPTUNE SPECIMEN COLLECT (MISCELLANEOUS) ×1 IMPLANT
WATER STERILE IRR 500ML POUR (IV SOLUTION) ×1 IMPLANT

## 2024-02-22 NOTE — Anesthesia Postprocedure Evaluation (Signed)
 Anesthesia Post Note  Patient: Tanya Conley  Procedure(s) Performed: EXCISION OF BREAST BIOPSY (Left)  Patient location during evaluation: PACU Anesthesia Type: General Level of consciousness: awake and alert Pain management: pain level controlled Vital Signs Assessment: post-procedure vital signs reviewed and stable Respiratory status: spontaneous breathing, nonlabored ventilation and respiratory function stable Cardiovascular status: blood pressure returned to baseline and stable Postop Assessment: no apparent nausea or vomiting Anesthetic complications: no   No notable events documented.   Last Vitals:  Vitals:   02/22/24 0933 02/22/24 0944  BP:  125/77  Pulse: 78 73  Resp: 16 16  Temp:  (!) 36.1 C  SpO2: 97% 100%    Last Pain:  Vitals:   02/22/24 0944  TempSrc: Temporal  PainSc: 0-No pain                 Tanya Conley

## 2024-02-22 NOTE — Anesthesia Preprocedure Evaluation (Signed)
 Anesthesia Evaluation  Patient identified by MRN, date of birth, ID band Patient awake    Reviewed: Allergy & Precautions, NPO status , Patient's Chart, lab work & pertinent test results  History of Anesthesia Complications (+) PONV and history of anesthetic complications  Airway Mallampati: III  TM Distance: <3 FB Neck ROM: full    Dental  (+) Chipped   Pulmonary neg pulmonary ROS, neg shortness of breath   Pulmonary exam normal        Cardiovascular Exercise Tolerance: Good hypertension, (-) angina (-) Past MI Normal cardiovascular exam     Neuro/Psych negative neurological ROS  negative psych ROS   GI/Hepatic Neg liver ROS,GERD  Controlled,,  Endo/Other  negative endocrine ROS    Renal/GU      Musculoskeletal   Abdominal   Peds  Hematology negative hematology ROS (+)   Anesthesia Other Findings Past Medical History: No date: GERD (gastroesophageal reflux disease) No date: History of mitral valve prolapse     Comment:  resolved No date: HTN (hypertension) 02/2024: Intraductal papilloma of left breast 02/2014: Left ureteral calculus No date: Menorrhagia No date: Palpitations No date: PONV (postoperative nausea and vomiting)  Past Surgical History: 2003: ABDOMINAL SURGERY     Comment:  benign tumor 02/03/2024: BREAST BIOPSY; Left     Comment:  US  LT BREAST BX W LOC DEV 1ST LESION IMG BX SPEC US                GUIDE 02/03/2024 [facility] 02/16/2024: BREAST BIOPSY; Left     Comment:  US  LT BREAST SAVI/RF TAG 1ST LESION US  GUIDE 02/16/2024               [facility] 01/29/2014: CESAREAN SECTION 03/02/2014: LITHOTRIPSY  BMI    Body Mass Index: 34.74 kg/m      Reproductive/Obstetrics negative OB ROS                              Anesthesia Physical Anesthesia Plan  ASA: 2  Anesthesia Plan: General LMA   Post-op Pain Management:    Induction:  Intravenous  PONV Risk Score and Plan: Dexamethasone , Ondansetron , Midazolam  and Treatment may vary due to age or medical condition  Airway Management Planned: LMA  Additional Equipment:   Intra-op Plan:   Post-operative Plan: Extubation in OR  Informed Consent: I have reviewed the patients History and Physical, chart, labs and discussed the procedure including the risks, benefits and alternatives for the proposed anesthesia with the patient or authorized representative who has indicated his/her understanding and acceptance.     Dental Advisory Given  Plan Discussed with: Anesthesiologist, CRNA and Surgeon  Anesthesia Plan Comments: (Patient consented for risks of anesthesia including but not limited to:  - adverse reactions to medications - damage to eyes, teeth, lips or other oral mucosa - nerve damage due to positioning  - sore throat or hoarseness - Damage to heart, brain, nerves, lungs, other parts of body or loss of life  Patient voiced understanding and assent.)        Anesthesia Quick Evaluation

## 2024-02-22 NOTE — Transfer of Care (Signed)
 Immediate Anesthesia Transfer of Care Note  Patient: Tanya Conley  Procedure(s) Performed: EXCISION OF BREAST BIOPSY (Left)  Patient Location: PACU  Anesthesia Type:General  Level of Consciousness: sedated  Airway & Oxygen Therapy: Patient Spontanous Breathing and Patient connected to face mask oxygen  Post-op Assessment: Report given to RN and Post -op Vital signs reviewed and stable  Post vital signs: Reviewed and stable  Last Vitals:  Vitals Value Taken Time  BP 84/52 02/22/24 08:52  Temp 36.2 C 02/22/24 08:50  Pulse 80 02/22/24 08:52  Resp 12 02/22/24 08:50  SpO2 99 % 02/22/24 08:52  Vitals shown include unfiled device data.  Last Pain:  Vitals:   02/22/24 0634  TempSrc: Temporal  PainSc: 0-No pain         Complications: No notable events documented.

## 2024-02-22 NOTE — Op Note (Signed)
 Operative Note  Pre-Op Diagnosis: Intraductal papilloma of the left breast Postoperative diagnosis: Intraductal papilloma of left breast Surgeon: Jayson Endow, MD Procedure: Localizer guided excisional biopsy of left breast EBL: 10 cc  After informed consent was obtained the patient was brought to the operating room placed supine on the operating room table.  General endotracheal anesthesia was then induced and her left breast was then prepped and draped in the usual sterile fashion.  Prior to prepping the left breast I did ensure that the [facility] scout localizer did pick up on the placed marker.  After she was appropriately prepped a surgical timeout was called identifying correct patient, site, side and procedure.  Again using the [facility] scout localizer I ensured that the area of concern was identified in made an incision over this.  This was a periareolar incision and prior to making incision I infiltrated it with 20 cc of half percent bupivacaine  with epinephrine .  The skin was dissected off of the breast tissue with Bovie cautery.  I then turned my attention to making the medial plane.  Using the [facility] scout I localized where the receiver was and developed a medial and superior plane.  On mammographic imaging preoperatively it looks like the biopsy clip was inferior and lateral to the [facility] scout localizer.  I then turned my attention to making my lateral margin.  I did come far off of the [facility] scout given the mammographic images as described above.  I then developed the inferior plane again making sure to include more tissue as the [facility] scout was not right over where the biopsy clip was.  The mass was then able to be elevated off of the posterior tissue.  A 3-0 Vicryl was used to secure the mass and I came across the posterior margin well ensuring that the [facility] scout was within the specimen.  I was then able to fully excise the mass.  It was marked appropriately with paint for margin status.  It was then  passed off the table as specimen.  An x-ray was performed confirming that we did have the [facility] scout as well as the heart-shaped clip within the biopsy specimen.  However, it appeared that the biopsy clip was quite close to our margins.  I then decided to excise additional inferior lateral and posterior margins.  The cavity was identified and the inferior lateral margin was grasped with DeBakey's and an additional inferior lateral margin was excised fully and passed off the table specimen.  I then identified the posterior edge of the lumpectomy and elevated this off and excised a posterior margin and passed this off as specimen.  The cavity was then irrigated with warm saline solution and hemostasis was obtained.  The deep breast tissue was loosely reapproximated with 3-0 Vicryl.  The deep dermal layer was then closed with 3-0 Vicryl and the skin was closed with 4-0 Monocryl and dressed with glue.  Prior to termination of procedure all sponge and instrument counts were correct x 2.  She was then woken from anesthesia and taken to the PACU in good condition

## 2024-02-22 NOTE — Anesthesia Procedure Notes (Signed)
 Procedure Name: LMA Insertion Date/Time: 02/22/2024 7:36 AM  Performed by: Dominica Krabbe, CRNAPre-anesthesia Checklist: Patient identified, Emergency Drugs available, Suction available, Patient being monitored and Timeout performed Patient Re-evaluated:Patient Re-evaluated prior to induction Oxygen Delivery Method: Circle system utilized Preoxygenation: Pre-oxygenation with 100% oxygen Induction Type: IV induction LMA: LMA inserted LMA Size: 4.0 Tube type: Oral Number of attempts: 1 Placement Confirmation: positive ETCO2 and breath sounds checked- equal and bilateral Dental Injury: Teeth and Oropharynx as per pre-operative assessment

## 2024-02-22 NOTE — H&P (Signed)
 No changes to below H and P, patient has had SAVI scout placed. Proceed with LEFT breast excisional biopsy.   CC: Left Breast Intraductal papilloma  History of Present Illness Tanya Conley is a 38 y.o. 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  The patient reports that several weeks ago she noticed some spontaneous yellow discharge from her left breast.  She says that she would also be able to express the discharge when she palpated around her nipple.  She presented to her PCP for evaluation and was referred for diagnostic mammogram.  She underwent a diagnostic mammogram that was consistent with a BI-RADS 4 and it was recommended that she undergo biopsy.  She completed a biopsy of the left breast lesion that was consistent pathology with intraductal papilloma.  She says that since then the discharge from her left nipple has stopped.  She denies any overlying skin changes, redness or palpable lumps.  She also denies any pain or bleeding from her nipple.  She has a significant family history of breast cancer in her mother.  Her mother was diagnosed at age 62 she thinks and died shortly thereafter.  She is a G3, P3 and was 18 at the age of her first pregnancy.  She does report that she breast-feeds.  She has not felt the lump.  She has never undergone genetic testing.  But she denies any other family history of cancers..   Past Medical History     Past Medical History:  Diagnosis Date   Kidney stone                   Past Surgical History:  Procedure Laterality Date   BREAST BIOPSY Left 02/03/2024    US  LT BREAST BX W LOC DEV 1ST LESION IMG BX SPEC US  GUIDE 02/03/2024 [facility]   CESAREAN SECTION       LITHOTRIPSY              Allergies  No Known Allergies     No current outpatient medications on file.      No current facility-administered medications for this visit.        Family History      Family  History  Problem Relation Age of Onset   Breast cancer Mother 32            Social History Social History  Social History         Tobacco Use   Smoking status: Never      Passive exposure: Past   Smokeless tobacco: Never  Vaping Use   Vaping status: Never Used  Substance Use Topics   Alcohol use: Never   Drug use: Never            ROS Full ROS of systems performed and is otherwise negative there than what is stated in the HPI   Physical Exam Blood pressure 130/74, pulse 78, temperature 98.1 F (36.7 C), height 4' 11 (1.499 m), weight 173 lb (78.5 kg), last menstrual period 01/21/2024, SpO2 99%.   Alert and oriented x 3, normal work of breathing on room air, regular rate and rhythm, abdomen soft, nontender nondistended, breast exam performed in the presence of a chaperone.  On the right breast there is no axillary lymphadenopathy.  No dominant lesions or skin changes on the right breast.  No nipple discharge or inversion on the right breast.  On the left breast there is  no left axillary lymphadenopathy.  There is Band-Aid over the left breast that was removed today.  The area of biopsy has healed well without any bruising, no dominant masses or skin changes.  I was unable to express any discharge from her nipple. Data Reviewed I have independently reviewed her mammogram.  At approximately 12:00 posterior to the nipple there is an area of concern on the diagnostic mammogram and there is an area of biopsy clip.  I have also independently reviewed her pathology was consistent with an intraductal papilloma.   I have personally reviewed the patient's imaging and medical records.     Assessment/Plan Assessment 38 year old female who had spontaneous nipple discharge and mammogram that was concerning.  She had a biopsy that showed intraductal papilloma.  I discussed the recommendations especially given her age and history of excision of intraductal papilloma.  We discussed the risk  of upgrade to DCIS or invasive cancer.  I discussed surgery including excisional biopsy with [facility] scout.  The risks benefits and alternatives of the procedure were talked about to her.  These risks include risk of infection, bleeding, seroma formation, wound breakdown, need for additional surgery and poor cosmetic outcomes.  She understands these risks and wishes to proceed with surgery.  Will have a Savi scout placed.   A total of 45 minutes was spent reviewing the patient's chart, performing history and physical and discussing treatment options with the patient.     Jayson MALVA Endow 02/11/2024, 2:56 PM

## 2024-02-23 ENCOUNTER — Encounter: Payer: Self-pay | Admitting: General Surgery

## 2024-02-23 ENCOUNTER — Ambulatory Visit
Admission: RE | Admit: 2024-02-23 | Discharge: 2024-02-23 | Disposition: A | Discharge status: Home / Self Care | Payer: Self-pay | Source: Ambulatory Visit | Attending: General Surgery | Admitting: General Surgery

## 2024-02-23 ENCOUNTER — Other Ambulatory Visit: Payer: Self-pay | Admitting: General Surgery

## 2024-02-23 DIAGNOSIS — Z9889 Other specified postprocedural states: Secondary | ICD-10-CM

## 2024-02-24 LAB — SURGICAL PATHOLOGY

## 2024-02-26 ENCOUNTER — Encounter: Payer: Self-pay | Admitting: General Surgery

## 2024-03-08 ENCOUNTER — Ambulatory Visit (INDEPENDENT_AMBULATORY_CARE_PROVIDER_SITE_OTHER): Payer: Self-pay | Admitting: General Surgery

## 2024-03-08 ENCOUNTER — Encounter: Payer: Self-pay | Admitting: General Surgery

## 2024-03-08 VITALS — BP 154/89 | HR 89 | Temp 98.0°F | Ht 59.0 in | Wt 172.0 lb

## 2024-03-08 DIAGNOSIS — Z09 Encounter for follow-up examination after completed treatment for conditions other than malignant neoplasm: Secondary | ICD-10-CM

## 2024-03-08 DIAGNOSIS — D242 Benign neoplasm of left breast: Secondary | ICD-10-CM

## 2024-03-08 DIAGNOSIS — D369 Benign neoplasm, unspecified site: Secondary | ICD-10-CM

## 2024-03-08 NOTE — Progress Notes (Signed)
 Outpatient Surgical Follow Up  03/08/2024  Tanya Conley is an 38 y.o. female.   Chief Complaint  Patient presents with   Routine Post Op    HPI: The patient returns today status post excisional biopsy of left breast lesion.  She reports doing well.  She says that her pain was very minimal and only required NSAIDs and Tylenol  for the first 2 days.  She says that initially she had a little bit of yellow drainage from the incision and yesterday had some very small amounts of bloody drainage but otherwise has been healing well.  She denies any overlying redness to the incision and also denies any purulent drainage.  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   Left ureteral calculus 02/2014   Menorrhagia    Palpitations    PONV (postoperative nausea and vomiting)     Past Surgical History:  Procedure Laterality Date   ABDOMINAL SURGERY  2003   benign tumor   BREAST BIOPSY Left 02/03/2024   US  LT BREAST BX W LOC DEV 1ST LESION IMG BX SPEC US  GUIDE 02/03/2024 [facility]   BREAST BIOPSY Left 02/16/2024   US  LT BREAST SAVI/RF TAG 1ST LESION US  GUIDE 02/16/2024 [facility]   CESAREAN SECTION  01/29/2014   EXCISION OF BREAST BIOPSY Left 02/22/2024   Procedure: EXCISION OF BREAST BIOPSY;  Surgeon: Marinda Jayson KIDD, MD;  Location: [facility];  Service: General;  Laterality: Left;   LITHOTRIPSY  03/02/2014    Family History  Problem Relation Age of Onset   Breast cancer Mother 45    Social History:  reports that she has never smoked. She has been exposed to tobacco smoke. She has never used smokeless tobacco. She reports that she does not drink alcohol and does not use drugs.  Allergies: No Known Allergies  Medications reviewed.    ROS Full ROS performed and is otherwise negative other than what is stated in HPI   BP (!)  154/89   Pulse 89   Temp 98 F (36.7 C) (Oral)   Ht 4' 11 (1.499 m)   Wt 172 lb (78 kg)   SpO2 99%   BMI 34.74 kg/m   Physical Exam Left breast exam performed in the presence of a chaperone.  Periareolar incision is healing well.  There is still some induration underneath the incision and it is slightly painful to palpation.  No overlying erythema.  I was unable to express any purulent drainage or serous or bloody drainage from the incisions.  Overall it looks like it is healing well.  Pathology reviewed and discussed with patient: Intraductal papilloma without any atypia or malignancy.    No results found for this or any previous visit (from the past 48 hours). No results found.  Assessment/Plan:  Patient status post excisional biopsy of left breast.  Overall healing well.  Incision has no signs of infection.  I did place a Steri-Strip over the incision.  There is still a little bit of surgical glue and I discussed with her that this will peel off over the next several weeks.  We also discussed pathology results and that there is no additional therapy needed at this time.  I encouraged her to continue to get routine screening mammography.  She can follow-up with our office as needed   Jayson Marinda, M.D.   [facility]

## 2024-03-08 NOTE — Patient Instructions (Signed)

## 2024-03-17 ENCOUNTER — Telehealth: Payer: Self-pay | Admitting: General Surgery

## 2024-03-17 NOTE — Telephone Encounter (Signed)
 Pt said that she is having a lot more thick yelloish with some brown in it and it has a bad smell to it. When it leaves it is sticking to her bra. Pt  # [phone]

## 2024-03-18 ENCOUNTER — Encounter: Payer: Self-pay | Admitting: Surgery

## 2024-03-18 ENCOUNTER — Ambulatory Visit (INDEPENDENT_AMBULATORY_CARE_PROVIDER_SITE_OTHER): Payer: Self-pay | Admitting: Surgery

## 2024-03-18 VITALS — BP 127/83 | HR 76 | Temp 98.6°F | Ht 59.0 in | Wt 170.0 lb

## 2024-03-18 DIAGNOSIS — D242 Benign neoplasm of left breast: Secondary | ICD-10-CM

## 2024-03-18 DIAGNOSIS — T8131XA Disruption of external operation (surgical) wound, not elsewhere classified, initial encounter: Secondary | ICD-10-CM

## 2024-03-18 DIAGNOSIS — D369 Benign neoplasm, unspecified site: Secondary | ICD-10-CM

## 2024-03-18 DIAGNOSIS — Z09 Encounter for follow-up examination after completed treatment for conditions other than malignant neoplasm: Secondary | ICD-10-CM

## 2024-03-18 NOTE — Patient Instructions (Addendum)
 Continue to change the dressing daily and keeping it clean and dry. If you have any questions or concerns please call the office     GENERAL POST-OPERATIVE PATIENT INSTRUCTIONS   WOUND CARE INSTRUCTIONS:  Keep a dry clean dressing on the wound if there is drainage. The initial bandage may be removed after 24 hours.  Once the wound has quit draining you may leave it open to air.  If clothing rubs against the wound or causes irritation and the wound is not draining you may cover it with a dry dressing during the daytime.  Try to keep the wound dry and avoid ointments on the wound unless directed to do so.  If the wound becomes bright red and painful or starts to drain infected material that is not clear, please contact your physician immediately.  If the wound is mildly pink and has a thick firm ridge underneath it, this is normal, and is referred to as a healing ridge.  This will resolve over the next 4-6 weeks.  BATHING: You may shower if you have been informed of this by your surgeon. However, Please do not submerge in a tub, hot tub, or pool until incisions are completely sealed or have been told by your surgeon that you may do so.  DIET:  You may eat any foods that you can tolerate.  It is a good idea to eat a high fiber diet and take in plenty of fluids to prevent constipation.  If you do become constipated you may want to take a mild laxative or take ducolax tablets on a daily basis until your bowel habits are regular.  Constipation can be very uncomfortable, along with straining, after recent surgery.  ACTIVITY:  You are encouraged to cough and deep breath or use your incentive spirometer if you were given one, every 15-30 minutes when awake.  This will help prevent respiratory complications and low grade fevers post-operatively if you had a general anesthetic.  You may want to hug a pillow when coughing and sneezing to add additional support to the surgical area, if you had abdominal or chest  surgery, which will decrease pain during these times.  You are encouraged to walk and engage in light activity for the next two weeks.  You should not lift more than 20 pounds for 6 weeks total after surgery as it could put you at increased risk for complications.  Twenty pounds is roughly equivalent to a plastic bag of groceries. At that time- Listen to your body when lifting, if you have pain when lifting, stop and then try again in a few days. Soreness after doing exercises or activities of daily living is normal as you get back in to your normal routine.  MEDICATIONS:  Try to take narcotic medications and anti-inflammatory medications, such as tylenol , ibuprofen, naprosyn, etc., with food.  This will minimize stomach upset from the medication.  Should you develop nausea and vomiting from the pain medication, or develop a rash, please discontinue the medication and contact your physician.  You should not drive, make important decisions, or operate machinery when taking narcotic pain medication.  SUNBLOCK Use sun block to incision area over the next year if this area will be exposed to sun. This helps decrease scarring and will allow you avoid a permanent darkened area over your incision.  QUESTIONS:  Please feel free to call our office if you have any questions, and we will be glad to assist you. [phone]

## 2024-03-18 NOTE — Progress Notes (Signed)
 03/18/2024  HPI: Tanya Conley is a 38 y.o. female s/p left breast lumpectomy on 02/22/2024 with Dr. Marinda for intraductal papilloma.  The patient called our office yesterday because she was noticing increased drainage from the left periareolar incision.  The patient was last seen by Dr. Marinda on 03/08/2024 at which time the wound had already some mild amount of drainage but the edges did not appear to be dehisced.  He applied a Steri-Strip over the wound as a precaution.  The patient reports that initially that had been going well but yesterday she noticed more drainage as well as an odor coming from that area.  She denies any worsening pain and denies any changes to the skin itself.  Vital signs: BP 127/83   Pulse 76   Temp 98.6 F (37 C) (Oral)   Ht 4' 11 (1.499 m)   Wt 170 lb (77.1 kg)   SpO2 98%   BMI 34.34 kg/m    Physical Exam: Constitutional: No acute distress Breast: Left breast periareolar incision has dehiscence of the lateral two thirds of the incision.  The skin edge separation is 2 cm x 3 mm, with a depth of about 3 mm as well.  There is minimal amount of fibrous material that was sharply debrided using scissors.  Otherwise the edges are healthy with good granulation tissue at the base.  No evidence of infection.  No significant erythema or induration.    Assessment/Plan: This is a 38 y.o. female s/p left breast lumpectomy with superficial wound dehiscence.  - Discussed with the patient the findings on exam today.  The wound does have approximately 2 cm x 3 mm area of dehiscence.  The wound edges are clean and healthy and I do not see any evidence of true infection.  At this point no antibiotics are needed.  Discussed with patient daily dressing changes with dry gauze.  I think the wound is not deep enough for trying to pack the wound.  Discussed with her that she can shower as normal and after her shower apply a new gauze dressing. - We will schedule the patient for follow-up  with Dr. Marinda on 03/24/2024 for another wound check. - Return precautions given.  Aloysius Sheree Plant, MD [facility]

## 2024-03-24 ENCOUNTER — Ambulatory Visit (INDEPENDENT_AMBULATORY_CARE_PROVIDER_SITE_OTHER): Payer: MEDICAID | Admitting: General Surgery

## 2024-03-24 ENCOUNTER — Encounter: Payer: Self-pay | Admitting: General Surgery

## 2024-03-24 VITALS — BP 142/89 | HR 69 | Ht 59.0 in | Wt 171.0 lb

## 2024-03-24 DIAGNOSIS — D369 Benign neoplasm, unspecified site: Secondary | ICD-10-CM

## 2024-03-24 DIAGNOSIS — Z09 Encounter for follow-up examination after completed treatment for conditions other than malignant neoplasm: Secondary | ICD-10-CM

## 2024-03-24 DIAGNOSIS — D242 Benign neoplasm of left breast: Secondary | ICD-10-CM

## 2024-03-24 DIAGNOSIS — T8131XD Disruption of external operation (surgical) wound, not elsewhere classified, subsequent encounter: Secondary | ICD-10-CM

## 2024-03-24 NOTE — Patient Instructions (Signed)
Please call the office if you have any questions or concerns. 

## 2024-03-28 NOTE — Progress Notes (Addendum)
 Outpatient Surgical Follow Up  Tanya Conley is an 38 y.o. female.   Chief Complaint  Patient presents with   Routine Post Op    HPI: Patient returns today status post left excisional biopsy.  She did have a little bit of dehiscence of her wound with some drainage.  She was seen by my partner last week and the skin edges were debrided.  Today she reports its much better.  She denies increased drainage from the wound.  She denies any purulence or surrounding redness.  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   Left ureteral calculus 02/2014   Menorrhagia    Palpitations    PONV (postoperative nausea and vomiting)     Past Surgical History:  Procedure Laterality Date   ABDOMINAL SURGERY  2003   benign tumor   BREAST BIOPSY Left 02/03/2024   US  LT BREAST BX W LOC DEV 1ST LESION IMG BX SPEC US  GUIDE 02/03/2024 [facility]   BREAST BIOPSY Left 02/16/2024   US  LT BREAST SAVI/RF TAG 1ST LESION US  GUIDE 02/16/2024 [facility]   CESAREAN SECTION  01/29/2014   EXCISION OF BREAST BIOPSY Left 02/22/2024   Procedure: EXCISION OF BREAST BIOPSY;  Surgeon: Marinda Jayson KIDD, MD;  Location: [facility];  Service: General;  Laterality: Left;   LITHOTRIPSY  03/02/2014    Family History  Problem Relation Age of Onset   Breast cancer Mother 51    Social History:  reports that she has never smoked. She has been exposed to tobacco smoke. She has never used smokeless tobacco. She reports that she does not drink alcohol and does not use drugs.  Allergies: No Known Allergies  Medications reviewed.    ROS Full ROS performed and is otherwise negative other than what is stated in HPI   BP (!) 142/89   Pulse 69   Ht 4' 11 (1.499 m)   Wt 171 lb (77.6 kg)   SpO2 100%   BMI 34.54 kg/m   Physical Exam  Left breast wound healing  well.  There was a little bit of a dehiscence but there is good granulation tissue at the base and there is only once completely healed up.  There is no purulence or surrounding erythema.   No results found for this or any previous visit (from the past 48 hours). No results found.  Assessment/Plan:  Patient with small dehiscence of left breast excisional biopsy wound.  There is no signs of infection and it is healing well on its own.  I discussed continue local wound care and she can place gauze over the wound if it is draining.  It will heal in on its own.  She can follow-up with us  as needed   Jayson Marinda, M.D. [facility]

## 2024-04-26 ENCOUNTER — Encounter: Payer: Self-pay | Admitting: General Surgery

## 2024-04-26 ENCOUNTER — Ambulatory Visit (INDEPENDENT_AMBULATORY_CARE_PROVIDER_SITE_OTHER): Payer: MEDICAID | Admitting: General Surgery

## 2024-04-26 VITALS — BP 131/78 | HR 98 | Temp 98.4°F | Ht 59.0 in | Wt 173.4 lb

## 2024-04-26 DIAGNOSIS — D242 Benign neoplasm of left breast: Secondary | ICD-10-CM

## 2024-04-26 DIAGNOSIS — Z09 Encounter for follow-up examination after completed treatment for conditions other than malignant neoplasm: Secondary | ICD-10-CM

## 2024-04-26 DIAGNOSIS — D369 Benign neoplasm, unspecified site: Secondary | ICD-10-CM

## 2024-04-26 NOTE — Patient Instructions (Signed)
 How to Do a Breast Self-Exam Doing breast self-exams can help you stay healthy. They're one way to know what's normal for your breasts. They can help you catch a problem while it's still small and can be treated. You need to: Check your breasts often. Tell your doctor about any changes. You should do breast self-exams even if you have breast implants. What you need: A mirror. A well-lit room. A pillow or other soft object. How to do a breast self-exam Look for changes  Take off all the clothes above your waist. Stand in front of a mirror in a room with good lighting. Put your hands down at your sides. Compare your breasts in the mirror. Look for difference between them, such as: Differences in shape. Differences in size. Wrinkles, dips, and bumps in one breast and not the other. Look at each breast for skin changes, such as: Redness. Scaly spots. Spots where your skin is thicker. Dimpling. Open sores. Look for changes in your nipples, such as: Fluid coming out of a nipple. Fluid around a nipple. Bleeding. Dimpling. Redness. A nipple that looks pushed in or that has changed position. Feel for changes Lie on your back. Feel each breast. To do this: Pick a breast to feel. Place a pillow under the shoulder closest to that breast. Put the arm closest to that breast behind your head. Feel the breast using the hand of your other arm. Use the pads of your three middle fingers to make small circles starting near the nipple. Use light, medium, and firm pressure. Keep making circles, moving down over the breast. Stop when you feel your ribs. Start making circles with your fingers again, this time going up until you reach your collarbone. Then, make circles out across your breast and into your armpit area. Squeeze your nipple. Check for fluid and lumps. Do these steps again to check your other breast. Sit or stand in the tub or shower. With soapy water on your skin, feel each breast  the same way you did when you were lying down. Write down what you find Writing down what you find can help you keep track of what you want to tell your doctor. Write down: What's normal for each breast. Any changes you find. Write down: The kind of change. If your breast feels tender or painful. Any lump you find. Write down its size and where it is. When you last had your period. General tips If you're breastfeeding, the best time to check your breasts is after you feed your baby or after you use a breast pump. If you get a period, the best time to check your breasts is 5-7 days after your period ends. With time, you'll get more used to doing the self-exam. You'll also start to know if there are changes in your breasts. Contact a doctor if: You see a change in the shape or size of your breasts or nipples. You see a change in the skin of your breast or nipples. You have fluid coming from your nipples that isn't normal. You find a new lump or thick area. You have breast pain. You have any concerns about your breast health. This information is not intended to replace advice given to you by your health care provider. Make sure you discuss any questions you have with your health care provider. Document Revised: 09/02/2023 Document Reviewed: 09/02/2023 Elsevier Patient Education  2025 [employer].

## 2024-04-26 NOTE — Progress Notes (Signed)
 Outpatient Surgical Follow Up  Tanya Conley is an 38 y.o. female.   Chief Complaint  Patient presents with   Routine Post Op    Left breast biopsy 02/22/24    HPI: Patient returns today status post left excisional biopsy.  Wound healing well. 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   Left ureteral calculus 02/2014   Menorrhagia    Palpitations    PONV (postoperative nausea and vomiting)     Past Surgical History:  Procedure Laterality Date   ABDOMINAL SURGERY  2003   benign tumor   BREAST BIOPSY Left 02/03/2024   US  LT BREAST BX W LOC DEV 1ST LESION IMG BX SPEC US  GUIDE 02/03/2024 [facility]   BREAST BIOPSY Left 02/16/2024   US  LT BREAST SAVI/RF TAG 1ST LESION US  GUIDE 02/16/2024 [facility]   CESAREAN SECTION  01/29/2014   EXCISION OF BREAST BIOPSY Left 02/22/2024   Procedure: EXCISION OF BREAST BIOPSY;  Surgeon: Marinda Jayson KIDD, MD;  Location: [facility];  Service: General;  Laterality: Left;   LITHOTRIPSY  03/02/2014    Family History  Problem Relation Age of Onset   Breast cancer Mother 9    Social History:  reports that she has never smoked. She has been exposed to tobacco smoke. She has never used smokeless tobacco. She reports that she does not drink alcohol and does not use drugs.  Allergies: No Known Allergies  Medications reviewed.    ROS Full ROS performed and is otherwise negative other than what is stated in HPI   BP 131/78   Pulse 98   Temp 98.4 F (36.9 C) (Oral)   Ht 4' 11 (1.499 m)   Wt 173 lb 6.4 oz (78.7 kg)   SpO2 98%   BMI 35.02 kg/m   Physical Exam  Left breast wound healing well.  Wound has healed in, minimal inudration tissue underneath incision but it is healed well   No results found for this or any previous visit (from the past 48 hours). No  results found.  Assessment/Plan:  Patient with small dehiscence of left breast excisional biopsy wound.  It has since healed. Recommended vitamin e cream for cosmesis. Can return PRN   Jayson Marinda, M.D. [facility]
